# Patient Record
Sex: Female | Born: 1940
Health system: Southern US, Community
[De-identification: ages and names within clinical notes are randomized; demographics above are authoritative.]

## PROBLEM LIST (undated history)

## (undated) ENCOUNTER — Ambulatory Visit: Admission: EM | Payer: Medicare HMO | Source: Home / Self Care

## (undated) DIAGNOSIS — I10 Essential (primary) hypertension: Secondary | ICD-10-CM

## (undated) DIAGNOSIS — E119 Type 2 diabetes mellitus without complications: Secondary | ICD-10-CM

## (undated) HISTORY — PX: CHOLECYSTECTOMY: SHX55

## (undated) HISTORY — PX: ABDOMINAL HYSTERECTOMY: SHX81

## (undated) HISTORY — PX: CARPAL TUNNEL RELEASE: SHX101

## (undated) HISTORY — DX: Essential (primary) hypertension: I10

## (undated) HISTORY — DX: Type 2 diabetes mellitus without complications: E11.9

---
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
## 2013-10-14 ENCOUNTER — Encounter: Payer: Self-pay | Admitting: Internal Medicine

## 2014-10-16 ENCOUNTER — Encounter: Payer: Self-pay | Admitting: Internal Medicine

## 2015-10-30 ENCOUNTER — Telehealth: Payer: Self-pay | Admitting: Internal Medicine

## 2015-10-30 NOTE — Telephone Encounter (Signed)
Rec'd from [facility] forward 38 pages to Dr.Burns

## 2015-12-02 LAB — HM DIABETES EYE EXAM

## 2015-12-29 ENCOUNTER — Other Ambulatory Visit (INDEPENDENT_AMBULATORY_CARE_PROVIDER_SITE_OTHER): Payer: Medicare PPO

## 2015-12-29 ENCOUNTER — Encounter: Payer: Self-pay | Admitting: Internal Medicine

## 2015-12-29 ENCOUNTER — Ambulatory Visit (INDEPENDENT_AMBULATORY_CARE_PROVIDER_SITE_OTHER): Payer: Medicare PPO | Admitting: Internal Medicine

## 2015-12-29 DIAGNOSIS — H04123 Dry eye syndrome of bilateral lacrimal glands: Secondary | ICD-10-CM | POA: Diagnosis not present

## 2015-12-29 DIAGNOSIS — E119 Type 2 diabetes mellitus without complications: Secondary | ICD-10-CM

## 2015-12-29 DIAGNOSIS — I10 Essential (primary) hypertension: Secondary | ICD-10-CM

## 2015-12-29 DIAGNOSIS — I152 Hypertension secondary to endocrine disorders: Secondary | ICD-10-CM | POA: Insufficient documentation

## 2015-12-29 DIAGNOSIS — E1142 Type 2 diabetes mellitus with diabetic polyneuropathy: Secondary | ICD-10-CM | POA: Insufficient documentation

## 2015-12-29 LAB — CBC WITH DIFFERENTIAL/PLATELET
Basophils Absolute: 0 10*3/uL (ref 0.0–0.1)
Basophils Relative: 0.5 % (ref 0.0–3.0)
EOS PCT: 0.9 % (ref 0.0–5.0)
Eosinophils Absolute: 0 10*3/uL (ref 0.0–0.7)
HCT: 35 % — ABNORMAL LOW (ref 36.0–46.0)
Hemoglobin: 11.7 g/dL — ABNORMAL LOW (ref 12.0–15.0)
LYMPHS ABS: 2.1 10*3/uL (ref 0.7–4.0)
Lymphocytes Relative: 42.7 % (ref 12.0–46.0)
MCHC: 33.3 g/dL (ref 30.0–36.0)
MCV: 83.9 fl (ref 78.0–100.0)
MONO ABS: 0.5 10*3/uL (ref 0.1–1.0)
MONOS PCT: 9.7 % (ref 3.0–12.0)
NEUTROS ABS: 2.3 10*3/uL (ref 1.4–7.7)
NEUTROS PCT: 46.2 % (ref 43.0–77.0)
PLATELETS: 247 10*3/uL (ref 150.0–400.0)
RBC: 4.18 Mil/uL (ref 3.87–5.11)
RDW: 15 % (ref 11.5–15.5)
WBC: 4.9 10*3/uL (ref 4.0–10.5)

## 2015-12-29 LAB — HEMOGLOBIN A1C: Hgb A1c MFr Bld: 5.9 % (ref 4.6–6.5)

## 2015-12-29 LAB — COMPREHENSIVE METABOLIC PANEL
ALBUMIN: 4.1 g/dL (ref 3.5–5.2)
ALK PHOS: 94 U/L (ref 39–117)
ALT: 17 U/L (ref 0–35)
AST: 17 U/L (ref 0–37)
BILIRUBIN TOTAL: 0.5 mg/dL (ref 0.2–1.2)
BUN: 17 mg/dL (ref 6–23)
CALCIUM: 9.8 mg/dL (ref 8.4–10.5)
CO2: 27 mEq/L (ref 19–32)
CREATININE: 0.74 mg/dL (ref 0.40–1.20)
Chloride: 107 mEq/L (ref 96–112)
GFR: 98.25 mL/min (ref >60.00)
Glucose, Bld: 110 mg/dL — ABNORMAL HIGH (ref 70–99)
Potassium: 3.7 mEq/L (ref 3.5–5.1)
Sodium: 142 mEq/L (ref 135–145)
TOTAL PROTEIN: 7.5 g/dL (ref 6.0–8.3)

## 2015-12-29 LAB — LIPID PANEL
CHOL/HDL RATIO: 3
Cholesterol: 200 mg/dL (ref 0–200)
HDL: 62 mg/dL (ref >39.00)
LDL Cholesterol: 110 mg/dL — ABNORMAL HIGH (ref 0–99)
NONHDL: 138.07
Triglycerides: 141 mg/dL (ref 0.0–149.0)
VLDL: 28.2 mg/dL (ref 0.0–40.0)

## 2015-12-29 LAB — MICROALBUMIN / CREATININE URINE RATIO
CREATININE, U: 142 mg/dL
Microalb Creat Ratio: 1.4 mg/g (ref 0.0–30.0)
Microalb, Ur: 2 mg/dL — ABNORMAL HIGH (ref 0.0–1.9)

## 2015-12-29 LAB — TSH: TSH: 0.51 u[IU]/mL (ref 0.35–4.50)

## 2015-12-29 NOTE — Patient Instructions (Addendum)
  Test(s) ordered today. Your results will be released to MyChart (or called to you) after review, usually within 72hours after test completion. If any changes need to be made, you will be notified at that same time.  Monitor your blood pressure at home.   No immunizations administered today.   Medications reviewed and updated.  No changes recommended at this time.  Please followup in 6 months

## 2015-12-29 NOTE — Progress Notes (Signed)
Pre visit review using our clinic review tool, if applicable. No additional management support is needed unless otherwise documented below in the visit note. 

## 2015-12-29 NOTE — Assessment & Plan Note (Signed)
Sugars controlled at home Check a1c, urine micro Follow up in 6 months

## 2015-12-29 NOTE — Assessment & Plan Note (Signed)
BP elevated here today Typically well controlled Advised her to start monitoring at home Check cmp Continue current medication F/u in 6 months

## 2015-12-29 NOTE — Progress Notes (Signed)
Subjective:    Patient ID: Wendy Rogers, female    DOB: July 30, 1940, 75 y.o.   MRN: [id number]  HPI She is here to establish with a new pcp.    Hypertension: She is taking her medication daily. She is compliant with a low sodium diet.  She denies chest pain, palpitations, daily edema, shortness of breath and regular headaches. She is exercising regularly.  She does not monitor her blood pressure at home.    Diabetes: She is taking her medication daily as prescribed. She is compliant with a diabetic diet. She is exercising regularly. She checks her feet daily and denies foot lesions. She is up-to-date with an ophthalmology examination.     Medications and allergies reviewed with patient and updated if appropriate.  Patient Active Problem List   Diagnosis Date Noted  . Essential hypertension, benign 12/29/2015  . Diabetes (HCC) 12/29/2015  . Dry eyes 12/29/2015      Medication List    losartan 50 MG tablet Commonly known as:  COZAAR Take 50 mg by mouth daily.   metFORMIN 500 MG tablet Commonly known as:  GLUCOPHAGE Take 500 mg by mouth daily. Take 1 tablet by mouth daily with dinner.        Past Medical History:  Diagnosis Date  . Diabetes mellitus without complication (HCC)   . Hypertension     Past Surgical History:  Procedure Laterality Date  . ABDOMINAL HYSTERECTOMY     fibroids  . CARPAL TUNNEL RELEASE Bilateral   . CHOLECYSTECTOMY      Social History   Social History  . Marital status: Widowed    Spouse name: N/A  . Number of children: N/A  . Years of education: N/A   Social History Main Topics  . Smoking status: Never Smoker  . Smokeless tobacco: Never Used  . Alcohol use Yes     Comment: occasional  . Drug use: No  . Sexual activity: Not Asked   Other Topics Concern  . None   Social History Narrative   Exercise: water aerobics 3 /week    Family History  Problem Relation Age of Onset  . Diabetes Mother   . Pancreatic cancer Sister      Review of Systems  Constitutional: Negative for chills, fatigue, fever and unexpected weight change.  Eyes: Negative for visual disturbance.  Respiratory: Negative for cough, shortness of breath and wheezing.   Cardiovascular: Positive for leg swelling (occasional). Negative for chest pain and palpitations.  Gastrointestinal: Negative for anal bleeding, blood in stool and nausea.       No gerd  Endocrine: Positive for cold intolerance.  Genitourinary: Negative for dysuria and hematuria.  Musculoskeletal: Positive for back pain (occasional).  Neurological: Positive for light-headedness (occasional). Negative for headaches.  Psychiatric/Behavioral: Negative for dysphoric mood. The patient is not nervous/anxious.        Objective:   Vitals:   12/29/15 0859  BP: (!) 184/82  Pulse: 78  Resp: 16  Temp: 97.8 F (36.6 C)   Filed Weights   12/29/15 0859  Weight: 168 lb (76.2 kg)   Body mass index is 33.93 kg/m.   Physical Exam Constitutional: She appears well-developed and well-nourished. No distress.  HENT:  Head: Normocephalic and atraumatic.  Right Ear: External ear normal. Normal ear canal and TM Left Ear: External ear normal.  Normal ear canal and TM Mouth/Throat: Oropharynx is clear and moist.  Eyes: Conjunctivae are normal.  Neck: Neck supple. No tracheal deviation present. No thyromegaly  present.  No carotid bruit  Cardiovascular: Normal rate, regular rhythm and normal heart sounds.   No murmur heard.  No edema. Pulmonary/Chest: Effort normal and breath sounds normal. No respiratory distress. She has no wheezes. She has no rales.  Abdominal: Soft. She exhibits no distension. There is no tenderness.  Lymphadenopathy: She has no cervical adenopathy.  Skin: Skin is warm and dry. She is not diaphoretic.  Psychiatric: She has a normal mood and affect. Her behavior is normal.         Assessment & Plan:   See Problem List for Assessment and Plan of chronic  medical problems.  F/u in 6 months

## 2016-02-23 ENCOUNTER — Other Ambulatory Visit: Payer: Self-pay | Admitting: *Deleted

## 2016-02-23 MED ORDER — METFORMIN HCL 500 MG PO TABS: 500.0000 mg | ORAL_TABLET | Freq: Every day | ORAL | 1 refills | Status: DC

## 2016-02-23 NOTE — Telephone Encounter (Signed)
Rec'd call pt is requesting refill on her metformin. Verified pharmacy inform will send to [facility] electronically...Raechel Chute/lmb

## 2016-07-03 NOTE — Progress Notes (Signed)
Subjective:    Patient ID: Wendy Rogers, female    DOB: 10-05-1940, 76 y.o.   MRN: [id number]  HPI The patient is here for follow up.  Hypertension: She is taking her medication daily. She is compliant with a low sodium diet.  She denies chest pain, palpitations, edema, shortness of breath and regular headaches. She is not exercising regularly.  She does monitor her blood pressure at home, But not regularly. The last time she took it a few days ago it was slightly elevated-she does not recall the exact number..    Diabetes: She is taking her medication daily as prescribed. She is compliant with a diabetic diet. She is not exercising regularly.  She checks her feet daily and denies foot lesions. She has some tingling and burning sensation in her feet.  She is up-to-date with an ophthalmology examination.   Feet pain, tingling: The other week her right first toe swelled and hurt.  Sometimes the balls of her feet swell.  Sometimes she has burning or needle sensation in her feet and lower legs. She sometimes has tingling in her hands.    Medications and allergies reviewed with patient and updated if appropriate.  Patient Active Problem List   Diagnosis Date Noted  . Tingling sensation 07/04/2016  . Essential hypertension, benign 12/29/2015  . Diabetes (HCC) 12/29/2015  . Dry eyes 12/29/2015    No current outpatient prescriptions on file prior to visit.   No current facility-administered medications on file prior to visit.     Past Medical History:  Diagnosis Date  . Diabetes mellitus without complication (HCC)   . Hypertension     Past Surgical History:  Procedure Laterality Date  . ABDOMINAL HYSTERECTOMY     fibroids  . CARPAL TUNNEL RELEASE Bilateral   . CHOLECYSTECTOMY      Social History   Social History  . Marital status: Widowed    Spouse name: N/A  . Number of children: N/A  . Years of education: N/A   Social History Main Topics  . Smoking status: Never  Smoker  . Smokeless tobacco: Never Used  . Alcohol use Yes     Comment: occasional  . Drug use: No  . Sexual activity: Not Asked   Other Topics Concern  . None   Social History Narrative   Exercise: water aerobics 3 /week    Family History  Problem Relation Age of Onset  . Diabetes Mother   . Pancreatic cancer Sister     Review of Systems  Constitutional: Negative for chills and fever.  Respiratory: Negative for cough, shortness of breath and wheezing.   Cardiovascular: Negative for chest pain, palpitations and leg swelling.  Neurological: Negative for light-headedness and headaches.       Objective:   Vitals:   07/04/16 0936  BP: (!) 172/92  Pulse: 94  Resp: 16  Temp: 98.3 F (36.8 C)   Wt Readings from Last 3 Encounters:  07/04/16 166 lb (75.3 kg)  12/29/15 168 lb (76.2 kg)   Body mass index is 33.53 kg/m.   Physical Exam    Constitutional: Appears well-developed and well-nourished. No distress.  HENT:  Head: Normocephalic and atraumatic.  Neck: Neck supple. No tracheal deviation present. No thyromegaly present.  No cervical lymphadenopathy Cardiovascular: Normal rate, regular rhythm and normal heart sounds.   No murmur heard. No carotid bruit .  No edema Pulmonary/Chest: Effort normal and breath sounds normal. No respiratory distress. No has no wheezes. No rales.  Skin: Skin is warm and dry. Not diaphoretic.  Psychiatric: Normal mood and affect. Behavior is normal.      Assessment & Plan:   Mammogram due-she requested a referral  See Problem List for Assessment and Plan of chronic medical problems.   Follow-up in 4 weeks to recheck blood pressure-losartan increase to 100 mg

## 2016-07-03 NOTE — Patient Instructions (Addendum)
  Test(s) ordered today. Your results will be released to MyChart (or called to you) after review, usually within 72hours after test completion. If any changes need to be made, you will be notified at that same time.  All other Health Maintenance issues reviewed.   All recommended immunizations and age-appropriate screenings are up-to-date or discussed.  No immunizations administered today.   Medications reviewed and updated.  Changes include increasing losartan 100 mg daily.   Your prescription(s) have been submitted to your pharmacy. Please take as directed and contact our office if you believe you are having problem(s) with the medication(s).   Please followup in 1 month

## 2016-07-04 ENCOUNTER — Other Ambulatory Visit: Payer: Self-pay | Admitting: Emergency Medicine

## 2016-07-04 ENCOUNTER — Telehealth: Payer: Self-pay | Admitting: *Deleted

## 2016-07-04 ENCOUNTER — Ambulatory Visit (INDEPENDENT_AMBULATORY_CARE_PROVIDER_SITE_OTHER): Payer: Medicare PPO | Admitting: Internal Medicine

## 2016-07-04 ENCOUNTER — Encounter: Payer: Self-pay | Admitting: Internal Medicine

## 2016-07-04 ENCOUNTER — Other Ambulatory Visit (INDEPENDENT_AMBULATORY_CARE_PROVIDER_SITE_OTHER): Payer: Medicare PPO

## 2016-07-04 VITALS — BP 172/92 | HR 94 | Temp 98.3°F | Resp 16 | Wt 166.0 lb

## 2016-07-04 DIAGNOSIS — I10 Essential (primary) hypertension: Secondary | ICD-10-CM | POA: Diagnosis not present

## 2016-07-04 DIAGNOSIS — E119 Type 2 diabetes mellitus without complications: Secondary | ICD-10-CM | POA: Diagnosis not present

## 2016-07-04 DIAGNOSIS — E538 Deficiency of other specified B group vitamins: Secondary | ICD-10-CM | POA: Insufficient documentation

## 2016-07-04 DIAGNOSIS — G629 Polyneuropathy, unspecified: Secondary | ICD-10-CM | POA: Insufficient documentation

## 2016-07-04 DIAGNOSIS — M79671 Pain in right foot: Secondary | ICD-10-CM

## 2016-07-04 DIAGNOSIS — Z1231 Encounter for screening mammogram for malignant neoplasm of breast: Secondary | ICD-10-CM | POA: Diagnosis not present

## 2016-07-04 DIAGNOSIS — R202 Paresthesia of skin: Secondary | ICD-10-CM | POA: Diagnosis not present

## 2016-07-04 DIAGNOSIS — M79672 Pain in left foot: Secondary | ICD-10-CM

## 2016-07-04 DIAGNOSIS — E1142 Type 2 diabetes mellitus with diabetic polyneuropathy: Secondary | ICD-10-CM | POA: Insufficient documentation

## 2016-07-04 LAB — COMPREHENSIVE METABOLIC PANEL
ALT: 21 U/L (ref 0–35)
AST: 19 U/L (ref 0–37)
Albumin: 4.3 g/dL (ref 3.5–5.2)
Alkaline Phosphatase: 86 U/L (ref 39–117)
BUN: 23 mg/dL (ref 6–23)
CHLORIDE: 108 meq/L (ref 96–112)
CO2: 28 meq/L (ref 19–32)
Calcium: 10 mg/dL (ref 8.4–10.5)
Creatinine, Ser: 0.82 mg/dL (ref 0.40–1.20)
GFR: 87.15 mL/min (ref >60.00)
GLUCOSE: 97 mg/dL (ref 70–99)
POTASSIUM: 3.7 meq/L (ref 3.5–5.1)
SODIUM: 143 meq/L (ref 135–145)
Total Bilirubin: 0.5 mg/dL (ref 0.2–1.2)
Total Protein: 7.6 g/dL (ref 6.0–8.3)

## 2016-07-04 LAB — CBC WITH DIFFERENTIAL/PLATELET
BASOS PCT: 0.7 % (ref 0.0–3.0)
Basophils Absolute: 0 10*3/uL (ref 0.0–0.1)
Eosinophils Absolute: 0 10*3/uL (ref 0.0–0.7)
Eosinophils Relative: 0.8 % (ref 0.0–5.0)
HEMATOCRIT: 37.8 % (ref 36.0–46.0)
HEMOGLOBIN: 12.2 g/dL (ref 12.0–15.0)
LYMPHS PCT: 48.4 % — AB (ref 12.0–46.0)
Lymphs Abs: 2.4 10*3/uL (ref 0.7–4.0)
MCHC: 32.3 g/dL (ref 30.0–36.0)
MCV: 87.7 fl (ref 78.0–100.0)
MONOS PCT: 8.1 % (ref 3.0–12.0)
Monocytes Absolute: 0.4 10*3/uL (ref 0.1–1.0)
NEUTROS ABS: 2.1 10*3/uL (ref 1.4–7.7)
Neutrophils Relative %: 42 % — ABNORMAL LOW (ref 43.0–77.0)
PLATELETS: 244 10*3/uL (ref 150.0–400.0)
RBC: 4.31 Mil/uL (ref 3.87–5.11)
RDW: 14.4 % (ref 11.5–15.5)
WBC: 5 10*3/uL (ref 4.0–10.5)

## 2016-07-04 LAB — HEMOGLOBIN A1C: Hgb A1c MFr Bld: 5.9 % (ref 4.6–6.5)

## 2016-07-04 LAB — VITAMIN B12: Vitamin B-12: 280 pg/mL (ref 211–911)

## 2016-07-04 MED ORDER — LOSARTAN POTASSIUM 100 MG PO TABS: 100.0000 mg | ORAL_TABLET | Freq: Every day | ORAL | 3 refills | Status: DC

## 2016-07-04 MED ORDER — METFORMIN HCL 500 MG PO TABS
500.0000 mg | ORAL_TABLET | Freq: Every day | ORAL | 3 refills | Status: DC
Start: 2016-07-04 — End: 2017-06-22

## 2016-07-04 MED ORDER — METFORMIN HCL 500 MG PO TABS: 500.0000 mg | ORAL_TABLET | Freq: Every day | ORAL | 1 refills | Status: DC

## 2016-07-04 MED ORDER — METFORMIN HCL 500 MG PO TABS
500.0000 mg | ORAL_TABLET | Freq: Every day | ORAL | 3 refills | Status: DC
Start: 2016-07-04 — End: 2016-07-04

## 2016-07-04 NOTE — Assessment & Plan Note (Signed)
Blood pressure not likely controlled Increase losartan 100 mg daily Continue to monitor blood pressure at home-monitor daily Check blood work Stressed low-sodium diet, regular exercise and advised weight loss Follow-up in 4 weeks

## 2016-07-04 NOTE — Telephone Encounter (Signed)
Rec'd call from pharmacy she states 2 rx was sent on pt losartan & metformin, but the date of birth is incorrect. Pt DOB September 20, 2040. Needing rx change w/correct DOB. Updated DOB re-sent rx...Raechel Chute/lmb

## 2016-07-04 NOTE — Progress Notes (Signed)
Pre visit review using our clinic review tool, if applicable. No additional management support is needed unless otherwise documented below in the visit note. 

## 2016-07-04 NOTE — Assessment & Plan Note (Signed)
In feet and hands Check B12 to rule out B12 deficiency Possible diabetic neuropathy, although sugars have been well controlled ? Other neuropathy

## 2016-07-04 NOTE — Assessment & Plan Note (Signed)
Bilateral balls of feet-associated with some swelling Likely related to osteoarthritis Discussed footwear Discussed possible referral to podiatry-deferred today

## 2016-07-04 NOTE — Assessment & Plan Note (Signed)
Sugars have been well controlled Check a1c Continue metformin 500 mg daily

## 2016-07-05 ENCOUNTER — Encounter: Payer: Self-pay | Admitting: Emergency Medicine

## 2016-08-03 NOTE — Progress Notes (Signed)
Subjective:    Patient ID: Wendy Rogers, female    DOB: 08-Jun-1940, 76 y.o.   MRN: [id number]  HPI The patient is here for follow up.  Hypertension: We increased her losartan to 100 mg one month ago.  She is taking her medication daily. She is compliant with a low sodium diet.  She denies chest pain, palpitations, edema, shortness of breath and regular headaches. She is not exercising regularly.  She does monitor her blood pressure at home, but the cuff appears to be too tight and may not be accurate.    Tingling in feet and hands: B12 level with recent blood work on on low-normal side.  She is taking B12 daily.    Medications and allergies reviewed with patient and updated if appropriate.  Patient Active Problem List   Diagnosis Date Noted  . Tingling sensation 07/04/2016  . Foot pain, bilateral 07/04/2016  . Low vitamin B12 level 07/04/2016  . Essential hypertension, benign 12/29/2015  . Diabetes (HCC) 12/29/2015  . Dry eyes 12/29/2015    Current Outpatient Prescriptions on File Prior to Visit  Medication Sig Dispense Refill  . losartan (COZAAR) 100 MG tablet Take 1 tablet (100 mg total) by mouth daily. 90 tablet 3  . metFORMIN (GLUCOPHAGE) 500 MG tablet Take 1 tablet (500 mg total) by mouth daily. With dinner 90 tablet 3   No current facility-administered medications on file prior to visit.     Past Medical History:  Diagnosis Date  . Diabetes mellitus without complication (HCC)   . Hypertension     Past Surgical History:  Procedure Laterality Date  . ABDOMINAL HYSTERECTOMY     fibroids  . CARPAL TUNNEL RELEASE Bilateral   . CHOLECYSTECTOMY      Social History   Social History  . Marital status: Widowed    Spouse name: N/A  . Number of children: N/A  . Years of education: N/A   Social History Main Topics  . Smoking status: Never Smoker  . Smokeless tobacco: Never Used  . Alcohol use Yes     Comment: occasional  . Drug use: No  . Sexual activity: Not  on file   Other Topics Concern  . Not on file   Social History Narrative   Exercise: water aerobics 3 /week    Family History  Problem Relation Age of Onset  . Diabetes Mother   . Pancreatic cancer Sister     Review of Systems  Eyes:       Dry eyes  Respiratory: Negative for shortness of breath.   Cardiovascular: Negative for chest pain, palpitations and leg swelling.  Neurological: Positive for headaches (occasional, no regular headaches). Negative for dizziness and light-headedness.       Objective:   Vitals:   08/04/16 0942  BP: (!) 172/88  Pulse: 99  Resp: 16  Temp: 97.9 F (36.6 C)   Wt Readings from Last 3 Encounters:  08/04/16 168 lb (76.2 kg)  07/04/16 166 lb (75.3 kg)  12/29/15 168 lb (76.2 kg)   Body mass index is 33.93 kg/m.   Physical Exam    Constitutional: Appears well-developed and well-nourished. No distress.  HENT:  Head: Normocephalic and atraumatic.  Neck: Neck supple. No tracheal deviation present. No thyromegaly present.  No cervical lymphadenopathy Cardiovascular: Normal rate, regular rhythm and normal heart sounds.   No murmur heard. No carotid bruit .  No edema Pulmonary/Chest: Effort normal and breath sounds normal. No respiratory distress. No has no wheezes.  No rales.  Skin: Skin is warm and dry. Not diaphoretic.  Psychiatric: Normal mood and affect. Behavior is normal.      Assessment & Plan:    See Problem List for Assessment and Plan of chronic medical problems.

## 2016-08-04 ENCOUNTER — Encounter: Payer: Self-pay | Admitting: Internal Medicine

## 2016-08-04 ENCOUNTER — Ambulatory Visit (INDEPENDENT_AMBULATORY_CARE_PROVIDER_SITE_OTHER): Payer: Medicare PPO | Admitting: Internal Medicine

## 2016-08-04 VITALS — BP 172/88 | HR 99 | Temp 97.9°F | Resp 16 | Wt 168.0 lb

## 2016-08-04 DIAGNOSIS — I10 Essential (primary) hypertension: Secondary | ICD-10-CM | POA: Diagnosis not present

## 2016-08-04 MED ORDER — AMLODIPINE BESYLATE 5 MG PO TABS: 5.0000 mg | ORAL_TABLET | Freq: Every day | ORAL | 3 refills | Status: DC

## 2016-08-04 MED ORDER — VITAMIN B-12 1000 MCG PO TABS: 1000.0000 ug | ORAL_TABLET | Freq: Every day | ORAL | Status: AC

## 2016-08-04 NOTE — Progress Notes (Signed)
Pre visit review using our clinic review tool, if applicable. No additional management support is needed unless otherwise documented below in the visit note. 

## 2016-08-04 NOTE — Assessment & Plan Note (Signed)
Not controlled discussed consequences of uncontrolled htn Start amlodipine 5 mg daily Continue losartan 100 mg daily Monitor at home - new cuff

## 2016-08-04 NOTE — Patient Instructions (Addendum)
Medications reviewed and updated.  Changes include  Adding amlodipine 5 mg daily.   Your prescription(s) have been submitted to your pharmacy. Please take as directed and contact our office if you believe you are having problem(s) with the medication(s).    Please followup in 3 weeks     Hypertension Hypertension, commonly called high blood pressure, is when the force of blood pumping through the arteries is too strong. The arteries are the blood vessels that carry blood from the heart throughout the body. Hypertension forces the heart to work harder to pump blood and may cause arteries to become narrow or stiff. Having untreated or uncontrolled hypertension can cause heart attacks, strokes, kidney disease, and other problems. A blood pressure reading consists of a higher number over a lower number. Ideally, your blood pressure should be below 120/80. The first ("top") number is called the systolic pressure. It is a measure of the pressure in your arteries as your heart beats. The second ("bottom") number is called the diastolic pressure. It is a measure of the pressure in your arteries as the heart relaxes. What are the causes? The cause of this condition is not known. What increases the risk? Some risk factors for high blood pressure are under your control. Others are not. Factors you can change   Smoking.  Having type 2 diabetes mellitus, high cholesterol, or both.  Not getting enough exercise or physical activity.  Being overweight.  Having too much fat, sugar, calories, or salt (sodium) in your diet.  Drinking too much alcohol. Factors that are difficult or impossible to change   Having chronic kidney disease.  Having a family history of high blood pressure.  Age. Risk increases with age.  Race. You may be at higher risk if you are African-American.  Gender. Men are at higher risk than women before age 72. After age 2, women are at higher risk than men.  Having  obstructive sleep apnea.  Stress. What are the signs or symptoms? Extremely high blood pressure (hypertensive crisis) may cause:  Headache.  Anxiety.  Shortness of breath.  Nosebleed.  Nausea and vomiting.  Severe chest pain.  Jerky movements you cannot control (seizures). How is this diagnosed? This condition is diagnosed by measuring your blood pressure while you are seated, with your arm resting on a surface. The cuff of the blood pressure monitor will be placed directly against the skin of your upper arm at the level of your heart. It should be measured at least twice using the same arm. Certain conditions can cause a difference in blood pressure between your right and left arms. Certain factors can cause blood pressure readings to be lower or higher than normal (elevated) for a short period of time:  When your blood pressure is higher when you are in a health care provider's office than when you are at home, this is called white coat hypertension. Most people with this condition do not need medicines.  When your blood pressure is higher at home than when you are in a health care provider's office, this is called masked hypertension. Most people with this condition may need medicines to control blood pressure. If you have a high blood pressure reading during one visit or you have normal blood pressure with other risk factors:  You may be asked to return on a different day to have your blood pressure checked again.  You may be asked to monitor your blood pressure at home for 1 week or longer. If  you are diagnosed with hypertension, you may have other blood or imaging tests to help your health care provider understand your overall risk for other conditions. How is this treated? This condition is treated by making healthy lifestyle changes, such as eating healthy foods, exercising more, and reducing your alcohol intake. Your health care provider may prescribe medicine if lifestyle  changes are not enough to get your blood pressure under control, and if:  Your systolic blood pressure is above 130.  Your diastolic blood pressure is above 80. Your personal target blood pressure may vary depending on your medical conditions, your age, and other factors. Follow these instructions at home: Eating and drinking   Eat a diet that is high in fiber and potassium, and low in sodium, added sugar, and fat. An example eating plan is called the DASH (Dietary Approaches to Stop Hypertension) diet. To eat this way:  Eat plenty of fresh fruits and vegetables. Try to fill half of your plate at each meal with fruits and vegetables.  Eat whole grains, such as whole wheat pasta, brown rice, or whole grain bread. Fill about one quarter of your plate with whole grains.  Eat or drink low-fat dairy products, such as skim milk or low-fat yogurt.  Avoid fatty cuts of meat, processed or cured meats, and poultry with skin. Fill about one quarter of your plate with lean proteins, such as fish, chicken without skin, beans, eggs, and tofu.  Avoid premade and processed foods. These tend to be higher in sodium, added sugar, and fat.  Reduce your daily sodium intake. Most people with hypertension should eat less than 1,500 mg of sodium a day.  Limit alcohol intake to no more than 1 drink a day for nonpregnant women and 2 drinks a day for men. One drink equals 12 oz of beer, 5 oz of wine, or 1 oz of hard liquor. Lifestyle   Work with your health care provider to maintain a healthy body weight or to lose weight. Ask what an ideal weight is for you.  Get at least 30 minutes of exercise that causes your heart to beat faster (aerobic exercise) most days of the week. Activities may include walking, swimming, or biking.  Include exercise to strengthen your muscles (resistance exercise), such as pilates or lifting weights, as part of your weekly exercise routine. Try to do these types of exercises for 30  minutes at least 3 days a week.  Do not use any products that contain nicotine or tobacco, such as cigarettes and e-cigarettes. If you need help quitting, ask your health care provider.  Monitor your blood pressure at home as told by your health care provider.  Keep all follow-up visits as told by your health care provider. This is important. Medicines   Take over-the-counter and prescription medicines only as told by your health care provider. Follow directions carefully. Blood pressure medicines must be taken as prescribed.  Do not skip doses of blood pressure medicine. Doing this puts you at risk for problems and can make the medicine less effective.  Ask your health care provider about side effects or reactions to medicines that you should watch for. Contact a health care provider if:  You think you are having a reaction to a medicine you are taking.  You have headaches that keep coming back (recurring).  You feel dizzy.  You have swelling in your ankles.  You have trouble with your vision. Get help right away if:  You develop a severe  headache or confusion.  You have unusual weakness or numbness.  You feel faint.  You have severe pain in your chest or abdomen.  You vomit repeatedly.  You have trouble breathing. Summary  Hypertension is when the force of blood pumping through your arteries is too strong. If this condition is not controlled, it may put you at risk for serious complications.  Your personal target blood pressure may vary depending on your medical conditions, your age, and other factors. For most people, a normal blood pressure is less than 120/80.  Hypertension is treated with lifestyle changes, medicines, or a combination of both. Lifestyle changes include weight loss, eating a healthy, low-sodium diet, exercising more, and limiting alcohol. This information is not intended to replace advice given to you by your health care provider. Make sure you  discuss any questions you have with your health care provider. Document Released: 05/16/2005 Document Revised: 04/13/2016 Document Reviewed: 04/13/2016 Elsevier Interactive Patient Education  2017 [employer].

## 2016-08-09 ENCOUNTER — Ambulatory Visit
Admission: RE | Admit: 2016-08-09 | Discharge: 2016-08-09 | Disposition: A | Discharge status: Home / Self Care | Payer: Medicare PPO | Source: Ambulatory Visit | Attending: Internal Medicine | Admitting: Internal Medicine

## 2016-08-09 DIAGNOSIS — Z1231 Encounter for screening mammogram for malignant neoplasm of breast: Secondary | ICD-10-CM

## 2016-10-13 ENCOUNTER — Encounter: Payer: Self-pay | Admitting: Internal Medicine

## 2016-12-11 NOTE — Patient Instructions (Addendum)
Your goal BP is less than 140/90.   Test(s) ordered today. Your results will be released to MyChart (or called to you) after review, usually within 72hours after test completion. If any changes need to be made, you will be notified at that same time.  All other Health Maintenance issues reviewed.   All recommended immunizations and age-appropriate screenings are up-to-date or discussed.  No immunizations administered today.   Medications reviewed and updated.   No changes recommended at this time.  Your prescription(s) have been submitted to your pharmacy. Please take as directed and contact our office if you believe you are having problem(s) with the medication(s).   Please followup in 5-6 months

## 2016-12-11 NOTE — Progress Notes (Signed)
Subjective:    Patient ID: Wendy Rogers, female    DOB: August 16, 1940, 76 y.o.   MRN: [id number]  HPI The patient is here for follow up.  Hypertension: She is taking her medication daily. She is not always compliant with a low sodium diet.  She denies chest pain, palpitations, edema, shortness of breath and regular headaches. She is exercising regularly.  She does monitor her blood pressure at home 153/80.    Diabetes: She is taking her medication daily as prescribed. She is compliant with a diabetic diet. She is exercising regularly. She checks her feet daily and denies foot lesions. She is up-to-date with an ophthalmology examination.   B12 def:  She is taking B12 daily.  She denies fatigue.  She does have intermittent prickly sensation in her feet.    Medications and allergies reviewed with patient and updated if appropriate.  Patient Active Problem List   Diagnosis Date Noted  . Tingling sensation 07/04/2016  . Foot pain, bilateral 07/04/2016  . Low vitamin B12 level 07/04/2016  . Essential hypertension, benign 12/29/2015  . Diabetes (HCC) 12/29/2015  . Dry eyes 12/29/2015    Current Outpatient Prescriptions on File Prior to Visit  Medication Sig Dispense Refill  . amLODipine (NORVASC) 5 MG tablet Take 1 tablet (5 mg total) by mouth daily. 30 tablet 3  . losartan (COZAAR) 100 MG tablet Take 1 tablet (100 mg total) by mouth daily. 90 tablet 3  . vitamin B-12 (CYANOCOBALAMIN) 1000 MCG tablet Take 1 tablet (1,000 mcg total) by mouth daily.    . metFORMIN (GLUCOPHAGE) 500 MG tablet Take 1 tablet (500 mg total) by mouth daily. With dinner 90 tablet 3   No current facility-administered medications on file prior to visit.     Past Medical History:  Diagnosis Date  . Diabetes mellitus without complication (HCC)   . Hypertension     Past Surgical History:  Procedure Laterality Date  . ABDOMINAL HYSTERECTOMY     fibroids  . CARPAL TUNNEL RELEASE Bilateral   .  CHOLECYSTECTOMY      Social History   Social History  . Marital status: Widowed    Spouse name: N/A  . Number of children: N/A  . Years of education: N/A   Social History Main Topics  . Smoking status: Never Smoker  . Smokeless tobacco: Never Used  . Alcohol use Yes     Comment: occasional  . Drug use: No  . Sexual activity: Not Asked   Other Topics Concern  . None   Social History Narrative   Exercise: water aerobics 3 /week    Family History  Problem Relation Age of Onset  . Diabetes Mother   . Pancreatic cancer Sister     Review of Systems  Constitutional: Negative for chills and fever.  Respiratory: Negative for cough, shortness of breath and wheezing.   Cardiovascular: Negative for chest pain, palpitations and leg swelling.  Endocrine: Positive for cold intolerance.  Musculoskeletal: Positive for arthralgias.  Neurological: Negative for light-headedness, numbness (intermittent prickling feeling feet) and headaches.       Objective:   Vitals:   12/12/16 0938  BP: (!) 172/84  Pulse: 96  Resp: 16  Temp: 98.8 F (37.1 C)   Wt Readings from Last 3 Encounters:  12/12/16 166 lb (75.3 kg)  08/04/16 168 lb (76.2 kg)  07/04/16 166 lb (75.3 kg)   Body mass index is 33.53 kg/m.   Physical Exam    Constitutional: Appears well-developed  and well-nourished. No distress.  HENT:  Head: Normocephalic and atraumatic.  Neck: Neck supple. No tracheal deviation present. No thyromegaly present.  No cervical lymphadenopathy Cardiovascular: Normal rate, regular rhythm and normal heart sounds.   No murmur heard. No carotid bruit .  No edema Pulmonary/Chest: Effort normal and breath sounds normal. No respiratory distress. No has no wheezes. No rales.  Skin: Skin is warm and dry. Not diaphoretic.  Psychiatric: Normal mood and affect. Behavior is normal.      Assessment & Plan:   Deferred all vaccines. Will think about dexa  See Problem List for Assessment and  Plan of chronic medical problems.

## 2016-12-12 ENCOUNTER — Encounter: Payer: Self-pay | Admitting: Internal Medicine

## 2016-12-12 ENCOUNTER — Ambulatory Visit (INDEPENDENT_AMBULATORY_CARE_PROVIDER_SITE_OTHER): Payer: Medicare PPO | Admitting: Internal Medicine

## 2016-12-12 ENCOUNTER — Other Ambulatory Visit (INDEPENDENT_AMBULATORY_CARE_PROVIDER_SITE_OTHER): Payer: Medicare PPO

## 2016-12-12 VITALS — BP 172/84 | HR 96 | Temp 98.8°F | Resp 16 | Wt 166.0 lb

## 2016-12-12 DIAGNOSIS — E538 Deficiency of other specified B group vitamins: Secondary | ICD-10-CM | POA: Diagnosis not present

## 2016-12-12 DIAGNOSIS — E119 Type 2 diabetes mellitus without complications: Secondary | ICD-10-CM | POA: Diagnosis not present

## 2016-12-12 DIAGNOSIS — I10 Essential (primary) hypertension: Secondary | ICD-10-CM

## 2016-12-12 LAB — COMPREHENSIVE METABOLIC PANEL
ALBUMIN: 4 g/dL (ref 3.5–5.2)
ALK PHOS: 72 U/L (ref 39–117)
ALT: 22 U/L (ref 0–35)
AST: 21 U/L (ref 0–37)
BUN: 21 mg/dL (ref 6–23)
CALCIUM: 9.9 mg/dL (ref 8.4–10.5)
CO2: 27 mEq/L (ref 19–32)
CREATININE: 0.78 mg/dL (ref 0.40–1.20)
Chloride: 110 mEq/L (ref 96–112)
GFR: 92.22 mL/min (ref >60.00)
Glucose, Bld: 125 mg/dL — ABNORMAL HIGH (ref 70–99)
POTASSIUM: 4 meq/L (ref 3.5–5.1)
SODIUM: 143 meq/L (ref 135–145)
TOTAL PROTEIN: 7 g/dL (ref 6.0–8.3)
Total Bilirubin: 0.3 mg/dL (ref 0.2–1.2)

## 2016-12-12 LAB — VITAMIN B12: Vitamin B-12: 391 pg/mL (ref 211–911)

## 2016-12-12 LAB — LIPID PANEL
CHOLESTEROL: 202 mg/dL — AB (ref 0–200)
HDL: 56.5 mg/dL (ref >39.00)
LDL Cholesterol: 122 mg/dL — ABNORMAL HIGH (ref 0–99)
NonHDL: 145.58
TRIGLYCERIDES: 119 mg/dL (ref 0.0–149.0)
Total CHOL/HDL Ratio: 4
VLDL: 23.8 mg/dL (ref 0.0–40.0)

## 2016-12-12 LAB — HEMOGLOBIN A1C: Hgb A1c MFr Bld: 5.8 % (ref 4.6–6.5)

## 2016-12-12 NOTE — Assessment & Plan Note (Signed)
Check a1c Continue low sugar / carb diet Continue regular exercise Will get eye report

## 2016-12-12 NOTE — Assessment & Plan Note (Signed)
Has intermittent prickling in feet Taking b12 daily Check level

## 2016-12-12 NOTE — Assessment & Plan Note (Signed)
Elevated here today, slightly elevated at home She is not compliant with a low sodium diet - discussed potential effects on organs of uncontrolled htn Stressed low sodium diet Continue regular exercise Monitor BP - goal is less than 140/90 - discussed if persistently elevated we should increase medication cmp today

## 2017-03-27 DIAGNOSIS — H903 Sensorineural hearing loss, bilateral: Secondary | ICD-10-CM | POA: Diagnosis not present

## 2017-05-12 NOTE — Progress Notes (Signed)
Subjective:   Wendy Rogers is a 76 y.o. female who presents for an Initial Medicare Annual Wellness Visit.  Review of Systems    No ROS.  Medicare Wellness Visit. Additional risk factors are reflected in the social history.  Cardiac Risk Factors include: advanced age (>32men, >74 women);diabetes mellitus;hypertension Sleep patterns: feels rested on waking, gets up 3-4 times nightly to void and sleeps 6-7 hours nightly.    Home Safety/Smoke Alarms: Feels safe in home. Smoke alarms in place.  Living environment; residence and Firearm Safety: 2-story house, no firearms.Lives with son, no needs for DME, good support system Seat Belt Safety/Bike Helmet: Wears seat belt.   Objective:    Today's Vitals   05/15/17 0909  BP: (!) 142/84  Pulse: 85  Resp: 16  Temp: 97.9 F (36.6 C)  TempSrc: Oral  SpO2: 96%  Weight: 165 lb (74.8 kg)  Height: 4\' 11"  (1.499 m)   Body mass index is 33.33 kg/m.  Advanced Directives 05/15/2017  Does Patient Have a Medical Advance Directive? No  Would patient like information on creating a medical advance directive? Yes (ED - Information included in AVS)    Current Medications (verified) Outpatient Encounter Medications as of 05/15/2017  Medication Sig  . amLODipine (NORVASC) 5 MG tablet Take 1 tablet (5 mg total) by mouth daily.  Marland Kitchen losartan (COZAAR) 100 MG tablet Take 1 tablet (100 mg total) by mouth daily.  . vitamin B-12 (CYANOCOBALAMIN) 1000 MCG tablet Take 1 tablet (1,000 mcg total) by mouth daily.  . metFORMIN (GLUCOPHAGE) 500 MG tablet Take 1 tablet (500 mg total) by mouth daily. With dinner   No facility-administered encounter medications on file as of 05/15/2017.     Allergies (verified) Patient has no known allergies.   History: Past Medical History:  Diagnosis Date  . Diabetes mellitus without complication (HCC)   . Hypertension    Past Surgical History:  Procedure Laterality Date  . ABDOMINAL HYSTERECTOMY     fibroids  .  CARPAL TUNNEL RELEASE Bilateral   . CHOLECYSTECTOMY     Family History  Problem Relation Age of Onset  . Diabetes Mother   . Pancreatic cancer Sister    Social History   Socioeconomic History  . Marital status: Widowed    Spouse name: None  . Number of children: None  . Years of education: None  . Highest education level: None  Social Needs  . Financial resource strain: None  . Food insecurity - worry: None  . Food insecurity - inability: None  . Transportation needs - medical: None  . Transportation needs - non-medical: None  Occupational History  . None  Tobacco Use  . Smoking status: Never Smoker  . Smokeless tobacco: Never Used  Substance and Sexual Activity  . Alcohol use: Yes    Comment: occasional  . Drug use: No  . Sexual activity: None  Other Topics Concern  . None  Social History Narrative   Exercise: water aerobics 3 /week    Tobacco Counseling Counseling given: Not Answered  Activities of Daily Living In your present state of health, do you have any difficulty performing the following activities: 05/15/2017  Hearing? N  Vision? N  Difficulty concentrating or making decisions? N  Walking or climbing stairs? N  Dressing or bathing? N  Doing errands, shopping? N  Preparing Food and eating ? N  Using the Toilet? N  In the past six months, have you accidently leaked urine? N  Do you have problems  with loss of bowel control? N  Managing your Medications? N  Managing your Finances? N  Housekeeping or managing your Housekeeping? N  Some recent data might be hidden     Immunizations and Health Maintenance  There is no immunization history on file for this patient. Health Maintenance Due  Topic Date Due  . OPHTHALMOLOGY EXAM  12/01/2016  . FOOT EXAM  12/28/2016    Patient Care Team: Pincus SanesBurns, Stacy J, MD as PCP - General (Internal Medicine)  Indicate any recent Medical Services you may have received from other than [facility] providers in the past year  (date may be approximate).     Assessment:   This is a routine wellness examination for Wendy Rogers. Physical assessment deferred to PCP.   Hearing/Vision screen Hearing Screening Comments: HOH, recently saw audiologist and is investigating options for hearing aids.   Vision Screening Comments: appointment yearly Dr. Jettie BoozeBernstoff  Dietary issues and exercise activities discussed: Current Exercise Habits: Home exercise routine, Type of exercise: walking, Time (Minutes): 55, Frequency (Times/Week): 6, Weekly Exercise (Minutes/Week): 330  Diet (meal preparation, eat out, water intake, caffeinated beverages, dairy products, fruits and vegetables): in general, a "healthy" diet  , well balanced, eats a variety of fruits and vegetables daily, limits salt, fat/cholesterol, sugar, caffeine, drinks 6-8 glasses of water daily.  Goals    . Patient Stated     Maintain current health status, continue to eat healthy, exercise, enjoy life and family.      Depression Screen PHQ 2/9 Scores 05/15/2017 12/29/2015  PHQ - 2 Score 0 0    Fall Risk Fall Risk  05/15/2017 12/29/2015  Falls in the past year? No No    ICognitive Function: MMSE - Mini Mental State Exam 05/15/2017  Orientation to time 5  Orientation to Place 5  Registration 3  Attention/ Calculation 5  Recall 2  Language- name 2 objects 2  Language- repeat 1  Language- follow 3 step command 3  Language- read & follow direction 1  Write a sentence 1  Copy design 1  Total score 29        Screening Tests Health Maintenance  Topic Date Due  . OPHTHALMOLOGY EXAM  12/01/2016  . FOOT EXAM  12/28/2016  . TETANUS/TDAP  12/12/2017 (Originally 06/[birth year]/1959)  . PNA vac Low Risk Adult (1 of 2 - PCV13) 12/12/2017 (Originally 06/10/2005)  . INFLUENZA VACCINE  01/15/2018 (Originally 12/28/2016)  . DEXA SCAN  05/15/2018 (Originally 06/10/2005)  . HEMOGLOBIN A1C  06/14/2017     Plan:    Continue doing brain stimulating activities (puzzles, reading,  adult coloring books, staying active) to keep memory sharp.   Continue to eat heart healthy diet (full of fruits, vegetables, whole grains, lean protein, water--limit salt, fat, and sugar intake) and increase physical activity as tolerated.  I have personally reviewed and noted the following in the patient's chart:   . Medical and social history . Use of alcohol, tobacco or illicit drugs  . Current medications and supplements . Functional ability and status . Nutritional status . Physical activity . Advanced directives . List of other physicians . Vitals . Screenings to include cognitive, depression, and falls . Referrals and appointments  In addition, I have reviewed and discussed with patient certain preventive protocols, quality metrics, and best practice recommendations. A written personalized care plan for preventive services as well as general preventive health recommendations were provided to patient.     Wanda PlumpJill A Avynn Klassen, RN   05/15/2017    Medical  screening examination/treatment/procedure(s) were performed by non-physician practitioner and as supervising physician I was immediately available for consultation/collaboration. I agree with above. Pincus SanesStacy J Burns, MD

## 2017-05-13 NOTE — Patient Instructions (Addendum)
Have blood work done today.  Follow up in 6 months  Please call and request Dr. Darcus AustinBernstorff to send last eye exam results to Dr. Lawerance BachBurns.  Continue doing brain stimulating activities (puzzles, reading, adult coloring books, staying active) to keep memory sharp.   Continue to eat heart healthy diet (full of fruits, vegetables, whole grains, lean protein, water--limit salt, fat, and sugar intake) and increase physical activity as tolerated.  Ms. Wendy Rogers , Thank you for taking time to come for your Medicare Wellness Visit. I appreciate your ongoing commitment to your health goals. Please review the following plan we discussed and let me know if I can assist you in the future.   These are the goals we discussed: Goals    . Patient Stated     Maintain current health status, continue to eat healthy, exercise, enjoy life and family.       This is a list of the screening recommended for you and due dates:  Health Maintenance  Topic Date Due  . Eye exam for diabetics  12/01/2016  . Complete foot exam   12/28/2016  . Tetanus Vaccine  12/12/2017*  . Pneumonia vaccines (1 of 2 - PCV13) 12/12/2017*  . Flu Shot  01/15/2018*  . DEXA scan (bone density measurement)  05/15/2018*  . Hemoglobin A1C  06/14/2017  *Topic was postponed. The date shown is not the original due date.

## 2017-05-13 NOTE — Progress Notes (Signed)
Subjective:    Patient ID: Wendy Rogers, female    DOB: 05/14/1941, 76 y.o.   MRN: [id number]  HPI She is here for a physical exam.   She has had some skin changes - increased moles.  This may be genetic.  She is never seen dermatology, but would be interested in a skin check.  She has intermittent prickly sensation in her feet.  It occurs when she lays in the bed too long or stands too long.  If she moves around it goes away.   She has arthritis in her right thumb.  The other day it cramped up.  She denies other joint pain.  Medications and allergies reviewed with patient and updated if appropriate.  Patient Active Problem List   Diagnosis Date Noted  . Tingling sensation 07/04/2016  . Foot pain, bilateral 07/04/2016  . Low vitamin B12 level 07/04/2016  . Essential hypertension, benign 12/29/2015  . Diabetes (HCC) 12/29/2015  . Dry eyes 12/29/2015    Current Outpatient Medications on File Prior to Visit  Medication Sig Dispense Refill  . amLODipine (NORVASC) 5 MG tablet Take 1 tablet (5 mg total) by mouth daily. 30 tablet 3  . losartan (COZAAR) 100 MG tablet Take 1 tablet (100 mg total) by mouth daily. 90 tablet 3  . vitamin B-12 (CYANOCOBALAMIN) 1000 MCG tablet Take 1 tablet (1,000 mcg total) by mouth daily.    . metFORMIN (GLUCOPHAGE) 500 MG tablet Take 1 tablet (500 mg total) by mouth daily. With dinner 90 tablet 3   No current facility-administered medications on file prior to visit.     Past Medical History:  Diagnosis Date  . Diabetes mellitus without complication (HCC)   . Hypertension     Past Surgical History:  Procedure Laterality Date  . ABDOMINAL HYSTERECTOMY     fibroids  . CARPAL TUNNEL RELEASE Bilateral   . CHOLECYSTECTOMY      Social History   Socioeconomic History  . Marital status: Widowed    Spouse name: None  . Number of children: None  . Years of education: None  . Highest education level: None  Social Needs  . Financial resource  strain: None  . Food insecurity - worry: None  . Food insecurity - inability: None  . Transportation needs - medical: None  . Transportation needs - non-medical: None  Occupational History  . None  Tobacco Use  . Smoking status: Never Smoker  . Smokeless tobacco: Never Used  Substance and Sexual Activity  . Alcohol use: Yes    Comment: occasional  . Drug use: No  . Sexual activity: None  Other Topics Concern  . None  Social History Narrative   Exercise: water aerobics 3 /week    Family History  Problem Relation Age of Onset  . Diabetes Mother   . Pancreatic cancer Sister     Review of Systems  Constitutional: Negative for chills and fever.  Eyes: Negative for visual disturbance.  Respiratory: Negative for cough, shortness of breath and wheezing.   Cardiovascular: Positive for leg swelling (occ). Negative for chest pain and palpitations.  Gastrointestinal: Positive for diarrhea (from certain foods). Negative for abdominal pain, blood in stool, constipation and nausea.  Genitourinary: Negative for dysuria and hematuria.  Musculoskeletal: Positive for arthralgias (r thumb). Negative for back pain.  Skin: Positive for color change.  Neurological: Negative for light-headedness and headaches.  Psychiatric/Behavioral: Negative for dysphoric mood. The patient is not nervous/anxious.  Objective:   Vitals:   05/15/17 0909  BP: (!) 142/84  Pulse: 85  Resp: 16  Temp: 97.9 F (36.6 C)  SpO2: 96%   Filed Weights   05/15/17 0909  Weight: 165 lb (74.8 kg)   Body mass index is 33.33 kg/m.  Wt Readings from Last 3 Encounters:  05/15/17 165 lb (74.8 kg)  12/12/16 166 lb (75.3 kg)  08/04/16 168 lb (76.2 kg)     Physical Exam Constitutional: She appears well-developed and well-nourished. No distress.  HENT:  Head: Normocephalic and atraumatic.  Right Ear: External ear normal. Normal ear canal and TM Left Ear: External ear normal.  Normal ear canal and  TM Mouth/Throat: Oropharynx is clear and moist.  Eyes: Conjunctivae and EOM are normal.  Neck: Neck supple. No tracheal deviation present. No thyromegaly present.  No carotid bruit  Cardiovascular: Normal rate, regular rhythm and normal heart sounds.   No murmur heard.  No edema. Pulmonary/Chest: Effort normal and breath sounds normal. No respiratory distress. She has no wheezes. She has no rales.  Breast: deferred Abdominal: Soft. She exhibits no distension. There is no tenderness.  Lymphadenopathy: She has no cervical adenopathy.  Skin: Skin is warm and dry. She is not diaphoretic.  several dark freckles/moles on face, hyperpigmented areas on arms Psychiatric: She has a normal mood and affect. Her behavior is normal.     Foot exam to be completed at wellness visit.     Assessment & Plan:   Physical exam: Screening blood work  ordered Immunizations   - deferred all Colonoscopy no longer needed due to age Mammogram  Up to date  - last done 07/2016 Dexa-discussed.  She declined having a bone density at this time Eye exams    up-to-date-we will request report EKG  None on file Exercise she is exercising regularly Weight   -advised weight loss.  She has lost some weight since she was here last Skin-concerned about increasing moles.  Will refer to dermatology for complete skin evaluation Substance abuse   none  See Problem List for Assessment and Plan of chronic medical problems.  Follow-up in 6 months, sooner if needed

## 2017-05-15 ENCOUNTER — Encounter: Payer: Self-pay | Admitting: Internal Medicine

## 2017-05-15 ENCOUNTER — Ambulatory Visit: Payer: Medicare PPO | Admitting: Internal Medicine

## 2017-05-15 ENCOUNTER — Other Ambulatory Visit (INDEPENDENT_AMBULATORY_CARE_PROVIDER_SITE_OTHER): Payer: Medicare PPO

## 2017-05-15 VITALS — BP 142/84 | HR 85 | Temp 97.9°F | Resp 16 | Ht 59.0 in | Wt 165.0 lb

## 2017-05-15 DIAGNOSIS — E538 Deficiency of other specified B group vitamins: Secondary | ICD-10-CM

## 2017-05-15 DIAGNOSIS — L989 Disorder of the skin and subcutaneous tissue, unspecified: Secondary | ICD-10-CM | POA: Insufficient documentation

## 2017-05-15 DIAGNOSIS — I10 Essential (primary) hypertension: Secondary | ICD-10-CM | POA: Diagnosis not present

## 2017-05-15 DIAGNOSIS — Z0001 Encounter for general adult medical examination with abnormal findings: Secondary | ICD-10-CM | POA: Diagnosis not present

## 2017-05-15 DIAGNOSIS — Z0000 Encounter for general adult medical examination without abnormal findings: Secondary | ICD-10-CM

## 2017-05-15 DIAGNOSIS — E119 Type 2 diabetes mellitus without complications: Secondary | ICD-10-CM | POA: Diagnosis not present

## 2017-05-15 LAB — CBC WITH DIFFERENTIAL/PLATELET
BASOS ABS: 0 10*3/uL (ref 0.0–0.1)
Basophils Relative: 0.7 % (ref 0.0–3.0)
EOS ABS: 0 10*3/uL (ref 0.0–0.7)
EOS PCT: 0.6 % (ref 0.0–5.0)
HCT: 37.3 % (ref 36.0–46.0)
HEMOGLOBIN: 12 g/dL (ref 12.0–15.0)
LYMPHS ABS: 2.1 10*3/uL (ref 0.7–4.0)
Lymphocytes Relative: 44.2 % (ref 12.0–46.0)
MCHC: 32.3 g/dL (ref 30.0–36.0)
MCV: 89 fl (ref 78.0–100.0)
MONO ABS: 0.4 10*3/uL (ref 0.1–1.0)
Monocytes Relative: 8.7 % (ref 3.0–12.0)
NEUTROS PCT: 45.8 % (ref 43.0–77.0)
Neutro Abs: 2.2 10*3/uL (ref 1.4–7.7)
Platelets: 253 10*3/uL (ref 150.0–400.0)
RBC: 4.19 Mil/uL (ref 3.87–5.11)
RDW: 14.6 % (ref 11.5–15.5)
WBC: 4.8 10*3/uL (ref 4.0–10.5)

## 2017-05-15 LAB — COMPREHENSIVE METABOLIC PANEL
ALBUMIN: 4.2 g/dL (ref 3.5–5.2)
ALK PHOS: 74 U/L (ref 39–117)
ALT: 23 U/L (ref 0–35)
AST: 23 U/L (ref 0–37)
BILIRUBIN TOTAL: 0.5 mg/dL (ref 0.2–1.2)
BUN: 30 mg/dL — AB (ref 6–23)
CO2: 26 mEq/L (ref 19–32)
CREATININE: 0.93 mg/dL (ref 0.40–1.20)
Calcium: 10 mg/dL (ref 8.4–10.5)
Chloride: 108 mEq/L (ref 96–112)
GFR: 75.2 mL/min (ref >60.00)
GLUCOSE: 100 mg/dL — AB (ref 70–99)
POTASSIUM: 4.1 meq/L (ref 3.5–5.1)
SODIUM: 142 meq/L (ref 135–145)
TOTAL PROTEIN: 7.5 g/dL (ref 6.0–8.3)

## 2017-05-15 LAB — LIPID PANEL
CHOLESTEROL: 183 mg/dL (ref 0–200)
HDL: 60.3 mg/dL (ref >39.00)
LDL CALC: 88 mg/dL (ref 0–99)
NonHDL: 122.54
TRIGLYCERIDES: 171 mg/dL — AB (ref 0.0–149.0)
Total CHOL/HDL Ratio: 3
VLDL: 34.2 mg/dL (ref 0.0–40.0)

## 2017-05-15 LAB — TSH: TSH: 0.31 u[IU]/mL — AB (ref 0.35–4.50)

## 2017-05-15 LAB — HEMOGLOBIN A1C: HEMOGLOBIN A1C: 5.9 % (ref 4.6–6.5)

## 2017-05-15 LAB — VITAMIN B12: Vitamin B-12: 385 pg/mL (ref 211–911)

## 2017-05-15 NOTE — Assessment & Plan Note (Signed)
Check A1c Continue metformin 500 mg daily Stressed continuing regular exercise Advised weight loss Eye exams up-to-date-we will obtain report

## 2017-05-15 NOTE — Assessment & Plan Note (Signed)
?   Taking B12 Will check level 

## 2017-05-15 NOTE — Assessment & Plan Note (Addendum)
BP Readings from Last 3 Encounters:  05/15/17 (!) 142/84  12/12/16 (!) 172/84  08/04/16 (!) 172/88   Bp elevated here minimally and seems to be fairly stable at home We will continue current medications and monitor Exercising Advised her to work on weight loss Continue low-sodium diet Check cmp, tsh, CBC

## 2017-05-15 NOTE — Assessment & Plan Note (Signed)
Increasing moles Will refer to dermatology for further evaluation

## 2017-05-17 ENCOUNTER — Other Ambulatory Visit: Payer: Self-pay | Admitting: Emergency Medicine

## 2017-05-17 ENCOUNTER — Encounter: Payer: Self-pay | Admitting: Emergency Medicine

## 2017-05-17 DIAGNOSIS — R7989 Other specified abnormal findings of blood chemistry: Secondary | ICD-10-CM

## 2017-05-19 ENCOUNTER — Encounter: Payer: Self-pay | Admitting: *Deleted

## 2017-05-19 ENCOUNTER — Other Ambulatory Visit: Payer: Self-pay | Admitting: *Deleted

## 2017-05-19 NOTE — Patient Outreach (Signed)
[facility] Care Management  05/19/2017  Wendy Rogers 01-10-1941 [id number]  Telephone Screen  Referral Date: 05/18/17 Referral Source: [name] Referral Reason:"Having financial difficulty with pay for essential bills, medications and food" Insurance: [employer]   Outreach attempt # 1 attempt to patient. HIPAA verified with patient. Patient reported, [place] with [place]. Patient had a wellness visit on 12/171/8. She doesn't know why the referral was initiated. She stated, she doesn't need assistance with her finances or medications. Patient stated, she is able to afford all of her medications and takes all of her medications as prescribed. [facility] services and benefits explained to patient. Patient agreed to receive [facility] Pamphlet and Brochure.  Plan: RM CM will send successful outreach letter to patient. RN CM advised patient to contact RN CM for any needs or concerns. RN CM will notify [facility] Case Management Assistant regarding case closure.   Wendy ClevelandJuanita Latonya Knight, RN, BSN, MHA/MSL, [facility] Telephonic Care Manager Coordinator [facility] Direct Phone: [phone] Toll Free: [phone] Fax: [phone]

## 2017-05-19 NOTE — Telephone Encounter (Signed)
This encounter was created in error - please disregard.

## 2017-06-21 NOTE — Progress Notes (Signed)
Subjective:    Patient ID: Wendy Rogers, female    DOB: July 22, 1940, 77 y.o.   MRN: [id number]  HPI She is here for an acute visit for left ear symptoms.  Her symptoms started 2 weeks ago  She is experiencing left ear discomfort.  The pain is intermittent.  Sometimes it hurts at night when she goes to bed.  She has intermittent ringing.  She has a feeling of a bubble in her ear.  She has been putting in ear drops - natural for ringing and she thinks that has helped.   When she chews and yawns it makes her ear feel better.  She denies anything that makes her symptoms worse -- it just comes and goes.  She denies grinding or clenching of her jaw.   Medications and allergies reviewed with patient and updated if appropriate.  Patient Active Problem List   Diagnosis Date Noted  . Skin abnormalities 05/15/2017  . Tingling sensation 07/04/2016  . Foot pain, bilateral 07/04/2016  . Low vitamin B12 level 07/04/2016  . Essential hypertension, benign 12/29/2015  . Diabetes (HCC) 12/29/2015  . Dry eyes 12/29/2015    Current Outpatient Medications on File Prior to Visit  Medication Sig Dispense Refill  . amLODipine (NORVASC) 5 MG tablet Take 1 tablet (5 mg total) by mouth daily. 30 tablet 3  . losartan (COZAAR) 100 MG tablet Take 1 tablet (100 mg total) by mouth daily. 90 tablet 3  . vitamin B-12 (CYANOCOBALAMIN) 1000 MCG tablet Take 1 tablet (1,000 mcg total) by mouth daily.    . metFORMIN (GLUCOPHAGE) 500 MG tablet Take 1 tablet (500 mg total) by mouth daily. With dinner 90 tablet 3   No current facility-administered medications on file prior to visit.     Past Medical History:  Diagnosis Date  . Diabetes mellitus without complication (HCC)   . Hypertension     Past Surgical History:  Procedure Laterality Date  . ABDOMINAL HYSTERECTOMY     fibroids  . CARPAL TUNNEL RELEASE Bilateral   . CHOLECYSTECTOMY      Social History   Socioeconomic History  . Marital status:  Widowed    Spouse name: None  . Number of children: None  . Years of education: None  . Highest education level: None  Social Needs  . Financial resource strain: None  . Food insecurity - worry: None  . Food insecurity - inability: None  . Transportation needs - medical: None  . Transportation needs - non-medical: None  Occupational History  . None  Tobacco Use  . Smoking status: Never Smoker  . Smokeless tobacco: Never Used  Substance and Sexual Activity  . Alcohol use: Yes    Comment: occasional  . Drug use: No  . Sexual activity: None  Other Topics Concern  . None  Social History Narrative   Exercise: water aerobics 3 /week    Family History  Problem Relation Age of Onset  . Diabetes Mother   . Pancreatic cancer Sister     Review of Systems  Constitutional: Negative for chills and fever.  HENT: Positive for ear pain (left ear, intermittent), hearing loss and tinnitus (intermittent). Negative for congestion, sinus pressure, sinus pain and sore throat.   Neurological: Negative for dizziness, light-headedness and headaches.       Objective:   Vitals:   06/22/17 1046  BP: (!) 150/82  Pulse: (!) 105  Resp: 16  Temp: 98.2 F (36.8 C)  SpO2: 96%   Filed  Weights   06/22/17 1046  Weight: 166 lb (75.3 kg)   Body mass index is 33.53 kg/m.  Wt Readings from Last 3 Encounters:  06/22/17 166 lb (75.3 kg)  05/15/17 165 lb (74.8 kg)  12/12/16 166 lb (75.3 kg)     Physical Exam GENERAL APPEARANCE: Appears stated age, well appearing, NAD EYES: conjunctiva clear, no icterus HEENT: bilateral tympanic membranes and ear canals normal, oropharynx with no erythema, no thyromegaly, trachea midline, no cervical or supraclavicular lymphadenopathy; no TMJ tenderness SKIN: warm, dry        Assessment & Plan:   See Problem List for Assessment and Plan of chronic medical problems.

## 2017-06-22 ENCOUNTER — Encounter: Payer: Self-pay | Admitting: Internal Medicine

## 2017-06-22 ENCOUNTER — Ambulatory Visit: Payer: Medicare PPO | Admitting: Internal Medicine

## 2017-06-22 VITALS — BP 150/82 | HR 105 | Temp 98.2°F | Resp 16 | Wt 166.0 lb

## 2017-06-22 DIAGNOSIS — H9312 Tinnitus, left ear: Secondary | ICD-10-CM

## 2017-06-22 DIAGNOSIS — H9202 Otalgia, left ear: Secondary | ICD-10-CM | POA: Diagnosis not present

## 2017-06-22 DIAGNOSIS — R7989 Other specified abnormal findings of blood chemistry: Secondary | ICD-10-CM | POA: Insufficient documentation

## 2017-06-22 DIAGNOSIS — E0590 Thyrotoxicosis, unspecified without thyrotoxic crisis or storm: Secondary | ICD-10-CM | POA: Insufficient documentation

## 2017-06-22 MED ORDER — LOSARTAN POTASSIUM 100 MG PO TABS: 100.0000 mg | ORAL_TABLET | Freq: Every day | ORAL | 3 refills | Status: DC

## 2017-06-22 MED ORDER — METFORMIN HCL 500 MG PO TABS: 500.0000 mg | ORAL_TABLET | Freq: Every day | ORAL | 3 refills | Status: DC

## 2017-06-22 NOTE — Assessment & Plan Note (Signed)
?   Related to hearing loss No excessive cerumen or infection Advised there is no treatment Can continue otc ear drops for ringing it they help

## 2017-06-22 NOTE — Assessment & Plan Note (Signed)
Intermittent Normal exam ? Atypical TMJ No active pain Treat symptomatically If no improvement can refer to ENT

## 2017-06-22 NOTE — Patient Instructions (Signed)
There is no evidence of an ear infection.  You can continue the ear drops if they help.    If your symptoms persist and you would like a referral to ENT let us know.     Your prescriptions were sent to your pharmacy.

## 2017-06-23 ENCOUNTER — Other Ambulatory Visit: Payer: Self-pay | Admitting: Internal Medicine

## 2017-06-30 ENCOUNTER — Other Ambulatory Visit: Payer: Self-pay | Admitting: Internal Medicine

## 2017-06-30 DIAGNOSIS — Z1231 Encounter for screening mammogram for malignant neoplasm of breast: Secondary | ICD-10-CM

## 2017-07-20 ENCOUNTER — Other Ambulatory Visit (INDEPENDENT_AMBULATORY_CARE_PROVIDER_SITE_OTHER): Payer: Medicare PPO

## 2017-07-20 DIAGNOSIS — R7989 Other specified abnormal findings of blood chemistry: Secondary | ICD-10-CM

## 2017-07-20 LAB — TSH: TSH: 0.39 u[IU]/mL (ref 0.35–4.50)

## 2017-08-01 DIAGNOSIS — L814 Other melanin hyperpigmentation: Secondary | ICD-10-CM | POA: Diagnosis not present

## 2017-08-01 DIAGNOSIS — L821 Other seborrheic keratosis: Secondary | ICD-10-CM | POA: Diagnosis not present

## 2017-08-01 DIAGNOSIS — L918 Other hypertrophic disorders of the skin: Secondary | ICD-10-CM | POA: Diagnosis not present

## 2017-08-01 DIAGNOSIS — L720 Epidermal cyst: Secondary | ICD-10-CM | POA: Diagnosis not present

## 2017-08-17 ENCOUNTER — Ambulatory Visit
Admission: RE | Admit: 2017-08-17 | Discharge: 2017-08-17 | Disposition: A | Discharge status: Home / Self Care | Payer: Medicare PPO | Source: Ambulatory Visit | Attending: Internal Medicine | Admitting: Internal Medicine

## 2017-08-17 DIAGNOSIS — Z1231 Encounter for screening mammogram for malignant neoplasm of breast: Secondary | ICD-10-CM | POA: Diagnosis not present

## 2017-09-22 DIAGNOSIS — H524 Presbyopia: Secondary | ICD-10-CM | POA: Diagnosis not present

## 2017-09-22 DIAGNOSIS — H11153 Pinguecula, bilateral: Secondary | ICD-10-CM | POA: Diagnosis not present

## 2017-09-22 DIAGNOSIS — H52221 Regular astigmatism, right eye: Secondary | ICD-10-CM | POA: Diagnosis not present

## 2017-09-22 DIAGNOSIS — H5203 Hypermetropia, bilateral: Secondary | ICD-10-CM | POA: Diagnosis not present

## 2017-09-22 DIAGNOSIS — H40012 Open angle with borderline findings, low risk, left eye: Secondary | ICD-10-CM | POA: Diagnosis not present

## 2017-09-22 DIAGNOSIS — H2513 Age-related nuclear cataract, bilateral: Secondary | ICD-10-CM | POA: Diagnosis not present

## 2017-09-22 DIAGNOSIS — E119 Type 2 diabetes mellitus without complications: Secondary | ICD-10-CM | POA: Diagnosis not present

## 2017-09-22 DIAGNOSIS — H04123 Dry eye syndrome of bilateral lacrimal glands: Secondary | ICD-10-CM | POA: Diagnosis not present

## 2017-09-22 DIAGNOSIS — H18413 Arcus senilis, bilateral: Secondary | ICD-10-CM | POA: Diagnosis not present

## 2017-09-22 LAB — HM DIABETES EYE EXAM

## 2017-09-29 ENCOUNTER — Encounter: Payer: Self-pay | Admitting: Internal Medicine

## 2017-10-16 DIAGNOSIS — H43813 Vitreous degeneration, bilateral: Secondary | ICD-10-CM | POA: Diagnosis not present

## 2017-10-16 DIAGNOSIS — H2513 Age-related nuclear cataract, bilateral: Secondary | ICD-10-CM | POA: Diagnosis not present

## 2017-11-02 DIAGNOSIS — H2511 Age-related nuclear cataract, right eye: Secondary | ICD-10-CM | POA: Diagnosis not present

## 2017-11-12 NOTE — Progress Notes (Signed)
Subjective:    Patient ID: Wendy Rogers, female    DOB: 10-18-1940, 77 y.o.   MRN: [id number]  HPI The patient is here for follow up.  She had cataract surgery 6/6 and the other eye soon.    Hypertension: She is taking her medication daily, but ran out of her medication three days ago. She is not always compliant with a low sodium diet.  She has mild b/l ankle edema at times.  She denies chest pain, palpitations, shortness of breath and regular headaches. She is not exercising regularly.  She does monitor her blood pressure at home, 145-150/?.    Diabetes: She is taking her medication daily as prescribed. She is compliant with a diabetic diet. She is not exercising regularly.   She is up-to-date with an ophthalmology examination.   B12 deficiency;  She is taking vitamin B12 daily.    Low tsh in the past:  She denies any changes in energy level or weight.   Medications and allergies reviewed with patient and updated if appropriate.  Patient Active Problem List   Diagnosis Date Noted  . Ear discomfort, left 06/22/2017  . Ringing in ear, left 06/22/2017  . Low TSH level 06/22/2017  . Skin abnormalities 05/15/2017  . Tingling sensation 07/04/2016  . Foot pain, bilateral 07/04/2016  . Low vitamin B12 level 07/04/2016  . Essential hypertension, benign 12/29/2015  . Diabetes (HCC) 12/29/2015  . Dry eyes 12/29/2015    Current Outpatient Medications on File Prior to Visit  Medication Sig Dispense Refill  . losartan (COZAAR) 100 MG tablet Take 1 tablet (100 mg total) by mouth daily. 90 tablet 3  . vitamin B-12 (CYANOCOBALAMIN) 1000 MCG tablet Take 1 tablet (1,000 mcg total) by mouth daily.    . metFORMIN (GLUCOPHAGE) 500 MG tablet Take 1 tablet (500 mg total) by mouth daily for 1 dose. With dinner 90 tablet 3   No current facility-administered medications on file prior to visit.     Past Medical History:  Diagnosis Date  . Diabetes mellitus without complication (HCC)   .  Hypertension     Past Surgical History:  Procedure Laterality Date  . ABDOMINAL HYSTERECTOMY     fibroids  . CARPAL TUNNEL RELEASE Bilateral   . CHOLECYSTECTOMY      Social History   Socioeconomic History  . Marital status: Widowed    Spouse name: Not on file  . Number of children: Not on file  . Years of education: Not on file  . Highest education level: Not on file  Occupational History  . Not on file  Social Needs  . Financial resource strain: Not on file  . Food insecurity:    Worry: Not on file    Inability: Not on file  . Transportation needs:    Medical: Not on file    Non-medical: Not on file  Tobacco Use  . Smoking status: Never Smoker  . Smokeless tobacco: Never Used  Substance and Sexual Activity  . Alcohol use: Yes    Comment: occasional  . Drug use: No  . Sexual activity: Not on file  Lifestyle  . Physical activity:    Days per week: Not on file    Minutes per session: Not on file  . Stress: Not on file  Relationships  . Social connections:    Talks on phone: Not on file    Gets together: Not on file    Attends religious service: Not on file  Active member of club or organization: Not on file    Attends meetings of clubs or organizations: Not on file    Relationship status: Not on file  Other Topics Concern  . Not on file  Social History Narrative   Exercise: water aerobics 3 /week    Family History  Problem Relation Age of Onset  . Diabetes Mother   . Pancreatic cancer Sister     Review of Systems  Constitutional: Negative for chills and fever.  Respiratory: Negative for cough, shortness of breath and wheezing.   Cardiovascular: Positive for leg swelling (mild). Negative for chest pain and palpitations.  Neurological: Negative for light-headedness and headaches.       Objective:   Vitals:   11/13/17 0904  BP: (!) 152/68  Pulse: 99  Resp: 16  Temp: 98.7 F (37.1 C)  SpO2: 96%   BP Readings from Last 3 Encounters:    11/13/17 (!) 152/68  06/22/17 (!) 150/82  05/15/17 (!) 142/84   Wt Readings from Last 3 Encounters:  11/13/17 161 lb (73 kg)  06/22/17 166 lb (75.3 kg)  05/15/17 165 lb (74.8 kg)   Body mass index is 32.52 kg/m.   Physical Exam    Constitutional: Appears well-developed and well-nourished. No distress.  HENT:  Head: Normocephalic and atraumatic.  Neck: Neck supple. No tracheal deviation present. No thyromegaly present.  No cervical lymphadenopathy Cardiovascular: Normal rate, regular rhythm and normal heart sounds.   No murmur heard. No carotid bruit .  No edema Pulmonary/Chest: Effort normal and breath sounds normal. No respiratory distress. No has no wheezes. No rales.  Skin: Skin is warm and dry. Not diaphoretic.  Psychiatric: Normal mood and affect. Behavior is normal.      Assessment & Plan:    See Problem List for Assessment and Plan of chronic medical problems.

## 2017-11-12 NOTE — Patient Instructions (Addendum)
  Test(s) ordered today. Your results will be released to MyChart (or called to you) after review, usually within 72hours after test completion. If any changes need to be made, you will be notified at that same time.    Medications reviewed and updated.  Changes include increasing amlodipine to 7.5 mg daily  Your prescription(s) have been submitted to your pharmacy. Please take as directed and contact our office if you believe you are having problem(s) with the medication(s).   Please followup in 3 months

## 2017-11-13 ENCOUNTER — Encounter: Payer: Self-pay | Admitting: Internal Medicine

## 2017-11-13 ENCOUNTER — Ambulatory Visit: Payer: Medicare PPO | Admitting: Internal Medicine

## 2017-11-13 ENCOUNTER — Other Ambulatory Visit (INDEPENDENT_AMBULATORY_CARE_PROVIDER_SITE_OTHER): Payer: Medicare PPO

## 2017-11-13 VITALS — BP 152/68 | HR 99 | Temp 98.7°F | Resp 16 | Wt 161.0 lb

## 2017-11-13 DIAGNOSIS — R7989 Other specified abnormal findings of blood chemistry: Secondary | ICD-10-CM | POA: Diagnosis not present

## 2017-11-13 DIAGNOSIS — E119 Type 2 diabetes mellitus without complications: Secondary | ICD-10-CM | POA: Diagnosis not present

## 2017-11-13 DIAGNOSIS — I10 Essential (primary) hypertension: Secondary | ICD-10-CM | POA: Diagnosis not present

## 2017-11-13 DIAGNOSIS — E538 Deficiency of other specified B group vitamins: Secondary | ICD-10-CM

## 2017-11-13 LAB — TSH: TSH: 0.15 u[IU]/mL — ABNORMAL LOW (ref 0.35–4.50)

## 2017-11-13 LAB — LIPID PANEL
CHOLESTEROL: 167 mg/dL (ref 0–200)
HDL: 50.8 mg/dL (ref >39.00)
LDL Cholesterol: 77 mg/dL (ref 0–99)
NONHDL: 115.7
TRIGLYCERIDES: 192 mg/dL — AB (ref 0.0–149.0)
Total CHOL/HDL Ratio: 3
VLDL: 38.4 mg/dL (ref 0.0–40.0)

## 2017-11-13 LAB — COMPREHENSIVE METABOLIC PANEL
ALBUMIN: 4.2 g/dL (ref 3.5–5.2)
ALK PHOS: 70 U/L (ref 39–117)
ALT: 27 U/L (ref 0–35)
AST: 30 U/L (ref 0–37)
BUN: 22 mg/dL (ref 6–23)
CO2: 28 mEq/L (ref 19–32)
Calcium: 9.7 mg/dL (ref 8.4–10.5)
Chloride: 107 mEq/L (ref 96–112)
Creatinine, Ser: 0.81 mg/dL (ref 0.40–1.20)
GFR: 88.08 mL/min (ref >60.00)
GLUCOSE: 91 mg/dL (ref 70–99)
POTASSIUM: 3.5 meq/L (ref 3.5–5.1)
Sodium: 144 mEq/L (ref 135–145)
TOTAL PROTEIN: 7.4 g/dL (ref 6.0–8.3)
Total Bilirubin: 0.6 mg/dL (ref 0.2–1.2)

## 2017-11-13 LAB — HEMOGLOBIN A1C: HEMOGLOBIN A1C: 6 % (ref 4.6–6.5)

## 2017-11-13 LAB — VITAMIN B12: Vitamin B-12: 376 pg/mL (ref 211–911)

## 2017-11-13 MED ORDER — AMLODIPINE BESYLATE 5 MG PO TABS: 5.0000 mg | ORAL_TABLET | Freq: Every day | ORAL | 5 refills | Status: DC

## 2017-11-13 MED ORDER — AMLODIPINE BESYLATE 5 MG PO TABS: 7.5000 mg | ORAL_TABLET | Freq: Every day | ORAL | 5 refills | Status: DC

## 2017-11-13 NOTE — Assessment & Plan Note (Signed)
tsh

## 2017-11-13 NOTE — Assessment & Plan Note (Signed)
Check a1c Low sugar / carb diet Stressed regular exercise   

## 2017-11-13 NOTE — Assessment & Plan Note (Addendum)
Goal BP < 140/90 Does not want to add a third medication Increase amlodipine to 7.5 mg daily Continue losartan 100 mg daily Encouraged low sodium diet and regular exercise Cmp, tsh

## 2017-11-13 NOTE — Assessment & Plan Note (Signed)
Taking b12 - continue Check B12 level

## 2017-12-03 DIAGNOSIS — H2512 Age-related nuclear cataract, left eye: Secondary | ICD-10-CM | POA: Diagnosis not present

## 2017-12-07 DIAGNOSIS — H2512 Age-related nuclear cataract, left eye: Secondary | ICD-10-CM | POA: Diagnosis not present

## 2018-01-02 DIAGNOSIS — Z961 Presence of intraocular lens: Secondary | ICD-10-CM | POA: Diagnosis not present

## 2018-02-14 ENCOUNTER — Ambulatory Visit: Payer: Self-pay

## 2018-02-14 NOTE — Telephone Encounter (Signed)
Patient called in with c/o "chest pain." She says "it started 2 weeks ago. I noticed it when I was exercising, so I stopped for a few days and it was still there. It is a 6, dull pain and it comes and goes. It's across my chest under my breasts from side to side and it goes to my upper back." I asked about other symptoms, she denies. According to protocol, see PCP within 3 days, appointment scheduled for tomorrow, 02/14/18 at 0845 with Dr. Lawerance BachBurns, care advice given, patient verbalized understanding.   Reason for Disposition . [1] Chest pain lasting <= 5 minutes AND [2] has not taken prescribed nitroglycerin  Answer Assessment - Initial Assessment Questions 1. LOCATION: "Where does it hurt?"       Starts on one side to the other side underneath both breast 2. RADIATION: "Does the pain go anywhere else?" (e.g., into neck, jaw, arms, back)     Goes to upper back 3. ONSET: "When did the chest pain begin?" (Minutes, hours or days)      2 weeks ago 4. PATTERN "Does the pain come and go, or has it been constant since it started?"  "Does it get worse with exertion?"      Come and go, no particular time frame  5. DURATION: "How long does it last" (e.g., seconds, minutes, hours)     I don't know 6. SEVERITY: "How bad is the pain?"  (e.g., Scale 1-10; mild, moderate, or severe)    - MILD (1-3): doesn't interfere with normal activities     - MODERATE (4-7): interferes with normal activities or awakens from sleep    - SEVERE (8-10): excruciating pain, unable to do any normal activities       6-dull pain 7. CARDIAC RISK FACTORS: "Do you have any history of heart problems or risk factors for heart disease?" (e.g., prior heart attack, angina; high blood pressure, diabetes, being overweight, high cholesterol, smoking, or strong family history of heart disease)     HTN, overweight 8. PULMONARY RISK FACTORS: "Do you have any history of lung disease?"  (e.g., blood clots in lung, asthma, emphysema, birth control  pills)     No 9. CAUSE: "What do you think is causing the chest pain?"     I thought maybe exercise 10. OTHER SYMPTOMS: "Do you have any other symptoms?" (e.g., dizziness, nausea, vomiting, sweating, fever, difficulty breathing, cough)       No 11. PREGNANCY: "Is there any chance you are pregnant?" "When was your last menstrual period?"       No  Protocols used: CHEST PAIN-A-AH

## 2018-02-14 NOTE — Patient Instructions (Addendum)
  Tests ordered today. Your results will be released to MyChart (or called to you) after review, usually within 72hours after test completion. If any changes need to be made, you will be notified at that same time.  Medications reviewed and updated.  Changes include :   none    A referral was ordered for cardiology

## 2018-02-14 NOTE — Progress Notes (Signed)
Subjective:    Patient ID: Wendy Rogers, female    DOB: 25-Oct-1940, 77 y.o.   MRN: [id number]  HPI The patient is here for an acute visit.  Chest pain, back pain:  It started two weeks ago.  She had cataract surgery a couple of weeks ago and the anesthesiologist heard a sounds - ? Murmur.  She has had intermittent pain in her back, side and around her lower chest since then.  The pain is located in her right side, right shoulder blade and around her lower chest.   The discomfort is sometimes sharp, but mostly dull.  it occurs with movement.   She will take an aspirin and it works.    She had one episode of heaviness in her chest, but denies chest pain.  The other day she was walking her dog - when she was done she felt more tired than usual and had heaviness that lasted 2-3 min and went away on its own.  She walks her dog three times a day and only had that feeling once.  She denies any palpitations, leg swelling, shortness of breath, headaches or lightheadedness.  She has gained weight and is unsure if that is related to her symptoms.  Besides walking the dog she is not doing any other exercise.  Low tsh: She denies any changes in energy level.  She states she is always cold.   Medications and allergies reviewed with patient and updated if appropriate.  Patient Active Problem List   Diagnosis Date Noted  . Ear discomfort, left 06/22/2017  . Ringing in ear, left 06/22/2017  . Low TSH level 06/22/2017  . Skin abnormalities 05/15/2017  . Tingling sensation 07/04/2016  . Foot pain, bilateral 07/04/2016  . Low vitamin B12 level 07/04/2016  . Essential hypertension, benign 12/29/2015  . Diabetes (HCC) 12/29/2015  . Dry eyes 12/29/2015    Current Outpatient Medications on File Prior to Visit  Medication Sig Dispense Refill  . amLODipine (NORVASC) 5 MG tablet Take 1.5 tablets (7.5 mg total) by mouth daily. 45 tablet 5  . losartan (COZAAR) 100 MG tablet Take 1 tablet (100 mg total)  by mouth daily. 90 tablet 3  . vitamin B-12 (CYANOCOBALAMIN) 1000 MCG tablet Take 1 tablet (1,000 mcg total) by mouth daily.    . metFORMIN (GLUCOPHAGE) 500 MG tablet Take 1 tablet (500 mg total) by mouth daily for 1 dose. With dinner 90 tablet 3   No current facility-administered medications on file prior to visit.     Past Medical History:  Diagnosis Date  . Diabetes mellitus without complication (HCC)   . Hypertension     Past Surgical History:  Procedure Laterality Date  . ABDOMINAL HYSTERECTOMY     fibroids  . CARPAL TUNNEL RELEASE Bilateral   . CHOLECYSTECTOMY      Social History   Socioeconomic History  . Marital status: Widowed    Spouse name: Not on file  . Number of children: Not on file  . Years of education: Not on file  . Highest education level: Not on file  Occupational History  . Not on file  Social Needs  . Financial resource strain: Not on file  . Food insecurity:    Worry: Not on file    Inability: Not on file  . Transportation needs:    Medical: Not on file    Non-medical: Not on file  Tobacco Use  . Smoking status: Never Smoker  . Smokeless tobacco: Never Used  Substance and Sexual Activity  . Alcohol use: Yes    Comment: occasional  . Drug use: No  . Sexual activity: Not on file  Lifestyle  . Physical activity:    Days per week: Not on file    Minutes per session: Not on file  . Stress: Not on file  Relationships  . Social connections:    Talks on phone: Not on file    Gets together: Not on file    Attends religious service: Not on file    Active member of club or organization: Not on file    Attends meetings of clubs or organizations: Not on file    Relationship status: Not on file  Other Topics Concern  . Not on file  Social History Narrative   Exercise: water aerobics 3 /week    Family History  Problem Relation Age of Onset  . Diabetes Mother   . Pancreatic cancer Sister     Review of Systems  Constitutional: Negative  for fever.  Respiratory: Negative for cough, shortness of breath and wheezing.   Cardiovascular: Positive for chest pain. Negative for palpitations and leg swelling.  Musculoskeletal: Positive for back pain.  Neurological: Negative for dizziness, light-headedness and headaches.       Objective:   Vitals:   02/15/18 0843  BP: 140/88  Pulse: 69  Resp: 16  Temp: 98.2 F (36.8 C)  SpO2: 96%   BP Readings from Last 3 Encounters:  02/15/18 140/88  11/13/17 (!) 152/68  06/22/17 (!) 150/82   Wt Readings from Last 3 Encounters:  02/15/18 164 lb (74.4 kg)  11/13/17 161 lb (73 kg)  06/22/17 166 lb (75.3 kg)   Body mass index is 33.12 kg/m.   Physical Exam  Constitutional: She appears well-developed and well-nourished.  HENT:  Head: Normocephalic and atraumatic.  Cardiovascular: Normal rate and regular rhythm.  No murmur heard. Pulmonary/Chest: Effort normal and breath sounds normal.  Abdominal: Soft. There is no tenderness.  Musculoskeletal:       Right lower leg: She exhibits no edema.       Left lower leg: She exhibits no edema.  No tenderness right mid back where she experiences the pain, but with certain movements she is able to reproduce the pain.  Tenderness with palpation across lower ribs under bra.  Skin: Skin is warm and dry.        Assessment & Plan:    See Problem List for Assessment and Plan of chronic medical problems.

## 2018-02-15 ENCOUNTER — Ambulatory Visit: Payer: Medicare PPO | Admitting: Internal Medicine

## 2018-02-15 ENCOUNTER — Encounter: Payer: Self-pay | Admitting: Internal Medicine

## 2018-02-15 ENCOUNTER — Other Ambulatory Visit (INDEPENDENT_AMBULATORY_CARE_PROVIDER_SITE_OTHER): Payer: Medicare PPO

## 2018-02-15 VITALS — BP 140/88 | HR 69 | Temp 98.2°F | Resp 16 | Ht 59.0 in | Wt 164.0 lb

## 2018-02-15 DIAGNOSIS — M546 Pain in thoracic spine: Secondary | ICD-10-CM | POA: Diagnosis not present

## 2018-02-15 DIAGNOSIS — R0789 Other chest pain: Secondary | ICD-10-CM | POA: Diagnosis not present

## 2018-02-15 DIAGNOSIS — R7989 Other specified abnormal findings of blood chemistry: Secondary | ICD-10-CM

## 2018-02-15 LAB — T3, FREE: T3 FREE: 2.9 pg/mL (ref 2.3–4.2)

## 2018-02-15 LAB — TSH: TSH: 0.48 u[IU]/mL (ref 0.35–4.50)

## 2018-02-15 LAB — T4, FREE: Free T4: 0.79 ng/dL (ref 0.60–1.60)

## 2018-02-15 NOTE — Assessment & Plan Note (Signed)
Intermittent right thoracic back pain that tends to come with movement, also having tenderness along lower ribs underneath her bra strap, which is likely from the pressure of the bra Likely musculoskeletal in nature and not related to her episode of chest heaviness Symptomatic treatment Monitor and she will let me know if it worsens

## 2018-02-15 NOTE — Assessment & Plan Note (Signed)
Recent TSH low and has been low in the past Check TFTs and thyroid antibodies Currently not having any thyroid symptoms

## 2018-02-15 NOTE — Assessment & Plan Note (Signed)
EKG not able to be performed today because EKG machines were not working She only had one episode of chest heaviness, but given risk factors would like her to have further evaluation Will refer to cardiology

## 2018-02-16 LAB — THYROID ANTIBODIES: Thyroperoxidase Ab SerPl-aCnc: 2 IU/mL (ref <9)

## 2018-02-19 ENCOUNTER — Encounter: Payer: Medicare PPO | Admitting: Internal Medicine

## 2018-02-19 VITALS — Ht 59.0 in

## 2018-02-26 ENCOUNTER — Encounter: Payer: Self-pay | Admitting: Cardiology

## 2018-02-26 ENCOUNTER — Ambulatory Visit: Payer: Medicare PPO | Admitting: Cardiology

## 2018-02-26 VITALS — BP 176/86 | HR 94 | Ht 59.0 in | Wt 162.0 lb

## 2018-02-26 DIAGNOSIS — I10 Essential (primary) hypertension: Secondary | ICD-10-CM | POA: Diagnosis not present

## 2018-02-26 DIAGNOSIS — E119 Type 2 diabetes mellitus without complications: Secondary | ICD-10-CM

## 2018-02-26 DIAGNOSIS — R0789 Other chest pain: Secondary | ICD-10-CM | POA: Diagnosis not present

## 2018-02-26 NOTE — Progress Notes (Signed)
PCP: Pincus Sanes, MD  Clinic Note: Chief Complaint  Patient presents with  . New Patient (Initial Visit)    Chest pain evaluation    HPI: Wendy Rogers is a 77 y.o. female who is being seen today for the evaluation of central right-sided chest pain at the request of Pincus Sanes, MD.  Wendy Rogers was seen on Sept 19, 2019 --> she noted having right-sided back pain that was also noted in center of her chest radiating to the right.  She described as a heaviness sensation in the center of the chest that happened shortly after walking her dog.  It lasted less than 10 seconds.  Did not occur while walking the dog.  She is referred for evaluation of chest pain.  Recent Hospitalizations: None  Studies Personally Reviewed - (if available, images/films reviewed: From Epic Chart or Care Everywhere)  None  Interval History: Ms. Wendy Rogers presents here today the to discuss this episode of chest pain noted above.  Other than that short episode of chest heaviness, she has not had any further chest pain.  She walks her dog at least 2-3 times a day may be about a half a mile each time.  If the dog goes fast, she may get little short of breath, but not with routine walking.  She has not ever had chest pain with walking, and only one episode after walking.  She thinks that that may have been a time when the dog was walking more briskly and, pulled on her arm some. She had no symptoms of dyspnea with this episode.  She has not had exertional dyspnea or resting dyspnea. She denies any PND, orthopnea or edema.  No palpitations, lightheadedness, dizziness, weakness or syncope/near syncope. No TIA/amaurosis fugax symptoms. No claudication.  ROS: A comprehensive was performed. Review of Systems  Constitutional: Negative for malaise/fatigue.  HENT: Negative for congestion and nosebleeds.   Eyes: Positive for blurred vision (She is having a little bit of issues with focusing after her recent cataract  surgery).  Respiratory: Negative for cough, shortness of breath and wheezing.   Cardiovascular: Positive for leg swelling (Mild end of day ankle swelling).  Gastrointestinal: Negative for blood in stool and melena.  Genitourinary: Negative for hematuria.  Musculoskeletal: Negative for joint pain.       Occasional toe cramps, happens a lot at night.  Neurological: Negative for dizziness, focal weakness, seizures and loss of consciousness.  Endo/Heme/Allergies: Does not bruise/bleed easily.  Psychiatric/Behavioral: Negative for depression and memory loss. The patient is not nervous/anxious and does not have insomnia.   All other systems reviewed and are negative.    I have reviewed and (if needed) personally updated the patient's problem list, medications, allergies, past medical and surgical history, social and family history.   Past Medical History:  Diagnosis Date  . Diabetes mellitus without complication (HCC)   . Hypertension     Past Surgical History:  Procedure Laterality Date  . ABDOMINAL HYSTERECTOMY     fibroids  . CARPAL TUNNEL RELEASE Bilateral   . CHOLECYSTECTOMY      Current Meds  Medication Sig  . amLODipine (NORVASC) 5 MG tablet Take 1.5 tablets (7.5 mg total) by mouth daily.  Marland Kitchen losartan (COZAAR) 100 MG tablet Take 1 tablet (100 mg total) by mouth daily.  . metFORMIN (GLUCOPHAGE) 500 MG tablet Take 1 tablet (500 mg total) by mouth daily for 1 dose. With dinner  . vitamin B-12 (CYANOCOBALAMIN) 1000 MCG tablet Take 1  tablet (1,000 mcg total) by mouth daily.    No Known Allergies  Social History   Tobacco Use  . Smoking status: Never Smoker  . Smokeless tobacco: Never Used  Substance Use Topics  . Alcohol use: Yes    Comment: occasional  . Drug use: No   Social History   Social History Narrative   She walks her dog routinely.  Exercise: water aerobics 3 /week    family history includes Diabetes in her mother; Pancreatic cancer in her sister.  Wt  Readings from Last 3 Encounters:  02/26/18 162 lb (73.5 kg)  02/15/18 164 lb (74.4 kg)  11/13/17 161 lb (73 kg)    PHYSICAL EXAM BP (!) 176/86   Pulse 94   Ht 4\' 11"  (1.499 m)   Wt 162 lb (73.5 kg)   BMI 32.72 kg/m  Physical Exam  Constitutional: She is oriented to person, place, and time. She appears well-developed and well-nourished. No distress.  HENT:  Head: Normocephalic and atraumatic.  Mouth/Throat: Oropharynx is clear and moist.  Eyes: Conjunctivae and EOM are normal.  Pupils are reactive, mild cataract changes.  not exactly round.  Neck: Normal range of motion. Neck supple. No hepatojugular reflux and no JVD present. Carotid bruit is not present.  Cardiovascular: Normal rate, regular rhythm, normal heart sounds and intact distal pulses.  No extrasystoles are present. PMI is not displaced (Very difficult to palpate). Exam reveals no gallop and no friction rub.  No murmur heard. Pulmonary/Chest: Effort normal and breath sounds normal. No respiratory distress. She has no wheezes. She has no rales.  Abdominal: Soft. Bowel sounds are normal. She exhibits no distension. There is no tenderness. There is no rebound.  No HSM  Musculoskeletal: Normal range of motion. She exhibits no edema.  Neurological: She is alert and oriented to person, place, and time. No cranial nerve deficit.  Psychiatric: She has a normal mood and affect. Her behavior is normal. Judgment and thought content normal.  Vitals reviewed.    Adult ECG Report EKG not done here today. From PCP dated 02/19/2018: Rate: 75 ;  Rhythm: normal sinus rhythm and Normal axis, intervals and durations.  Low voltage.;   Narrative Interpretation: Relatively normal EKG   Other studies Reviewed: Additional studies/ records that were reviewed today include:  Recent Labs:   Lab Results  Component Value Date   CHOL 167 11/13/2017   HDL 50.80 11/13/2017   LDLCALC 77 11/13/2017   TRIG 192.0 (H) 11/13/2017   CHOLHDL 3  11/13/2017   Lab Results  Component Value Date   CREATININE 0.81 11/13/2017   BUN 22 11/13/2017   NA 144 11/13/2017   K 3.5 11/13/2017   CL 107 11/13/2017   CO2 28 11/13/2017    ASSESSMENT / PLAN: Problem List Items Addressed This Visit    Chest heaviness - Primary    One episode of chest heaviness that occurred after exertion and not with exertion.  Does not sound classical for angina, both in the duration and not being associated with activity.  Plan: GXT (Graduated Exercise Treadmill Test) evaluation.      Relevant Orders   EXERCISE TOLERANCE TEST (ETT)   Diabetes (HCC) (Chronic)    She is diabetic like the majority of her family on both sides, she is on metformin only.  With hypertension and diabetes, she does have cardiac risk factors.       Relevant Orders   EXERCISE TOLERANCE TEST (ETT)   Essential hypertension, benign (Chronic)  Quite anxious today.  Blood pressure is pretty high today. Would probably consider increasing amlodipine to 10 mg daily. Is on max dose Cozaar, if additional blood pressure is required, it would probably use a beta-blocker such as carvedilol.         Current medicines are reviewed at length with the patient today.  (+/- concerns) n/a The following changes have been made:  n/a  Patient Instructions  MEDICATION INSTRUCTIONS  NO CHANGES AT PRESENT      SCHEDULE AT [address] Your physician has requested that you have an exercise tolerance test. For further information please visit [email or web address] Please also follow instruction sheet, as given.    Your physician recommends that you schedule a follow-up appointment in 1 MONTH WITH DR Andron Marrazzo. ( IF NEGATIVE, CAN FOLLOW UP ON AN AS NEEDED BASIS)   If you need a refill on your cardiac medications before your next appointment, please call your pharmacy.     Studies Ordered:   Orders Placed This Encounter  Procedures  . EXERCISE TOLERANCE TEST (ETT)       Bryan Lemma, M.D., M.S. Interventional Cardiologist   Pager # [phone] Phone # [phone] [address] [place], [place] [address]   Thank you for choosing [facility]!!

## 2018-02-26 NOTE — Patient Instructions (Addendum)
MEDICATION INSTRUCTIONS  NO CHANGES AT PRESENT      SCHEDULE AT [address] Your physician has requested that you have an exercise tolerance test. For further information please visit [email or web address] Please also follow instruction sheet, as given.    Your physician recommends that you schedule a follow-up appointment in 1 MONTH WITH DR HARDING. ( IF NEGATIVE, CAN FOLLOW UP ON AN AS NEEDED BASIS)   If you need a refill on your cardiac medications before your next appointment, please call your pharmacy.

## 2018-02-27 ENCOUNTER — Encounter: Payer: Self-pay | Admitting: Cardiology

## 2018-02-27 NOTE — Assessment & Plan Note (Signed)
One episode of chest heaviness that occurred after exertion and not with exertion.  Does not sound classical for angina, both in the duration and not being associated with activity.  Plan: GXT (Graduated Exercise Treadmill Test) evaluation.

## 2018-02-27 NOTE — Assessment & Plan Note (Signed)
Quite anxious today.  Blood pressure is pretty high today. Would probably consider increasing amlodipine to 10 mg daily. Is on max dose Cozaar, if additional blood pressure is required, it would probably use a beta-blocker such as carvedilol.

## 2018-02-27 NOTE — Assessment & Plan Note (Signed)
She is diabetic like the majority of her family on both sides, she is on metformin only.  With hypertension and diabetes, she does have cardiac risk factors.

## 2018-04-04 DIAGNOSIS — H903 Sensorineural hearing loss, bilateral: Secondary | ICD-10-CM | POA: Diagnosis not present

## 2018-04-19 ENCOUNTER — Telehealth (HOSPITAL_COMMUNITY): Payer: Self-pay

## 2018-04-19 NOTE — Telephone Encounter (Signed)
Encounter complete. 

## 2018-04-20 ENCOUNTER — Telehealth (HOSPITAL_COMMUNITY): Payer: Self-pay

## 2018-04-20 NOTE — Telephone Encounter (Signed)
Encounter complete. 

## 2018-04-24 ENCOUNTER — Inpatient Hospital Stay (HOSPITAL_COMMUNITY): Admission: RE | Admit: 2018-04-24 | Payer: Medicare PPO | Source: Ambulatory Visit

## 2018-04-30 ENCOUNTER — Encounter: Payer: Self-pay | Admitting: Cardiology

## 2018-05-04 ENCOUNTER — Telehealth: Payer: Self-pay

## 2018-05-04 DIAGNOSIS — R0789 Other chest pain: Secondary | ICD-10-CM

## 2018-05-04 DIAGNOSIS — E119 Type 2 diabetes mellitus without complications: Secondary | ICD-10-CM

## 2018-05-04 DIAGNOSIS — I10 Essential (primary) hypertension: Secondary | ICD-10-CM

## 2018-05-04 NOTE — Telephone Encounter (Signed)
New message     Just an FYI. We have made several attempts to contact this patient including sending a letter to schedule or reschedule their EXERCISE TOLERANCE TEST . We will be removing the patient from the WQ.   Thank you 

## 2018-05-07 NOTE — Telephone Encounter (Signed)
PATIENT CALLED BACK TO RESCHEDULE TEST

## 2018-05-07 NOTE — Addendum Note (Signed)
Addended by: Tobin ChadMARTIN, [place] V on: 05/07/2018 04:18 PM   Modules accepted: Orders

## 2018-05-08 ENCOUNTER — Ambulatory Visit: Payer: Medicare PPO | Admitting: Cardiology

## 2018-05-11 ENCOUNTER — Telehealth (HOSPITAL_COMMUNITY): Payer: Self-pay

## 2018-05-11 NOTE — Telephone Encounter (Signed)
Encounter complete. 

## 2018-05-16 ENCOUNTER — Encounter (HOSPITAL_COMMUNITY): Payer: Medicare PPO

## 2018-05-16 ENCOUNTER — Ambulatory Visit (HOSPITAL_COMMUNITY)
Admission: RE | Admit: 2018-05-16 | Discharge: 2018-05-16 | Disposition: A | Discharge status: Home / Self Care | Payer: Medicare PPO | Source: Ambulatory Visit | Attending: Cardiovascular Disease | Admitting: Cardiovascular Disease

## 2018-05-16 ENCOUNTER — Encounter (HOSPITAL_COMMUNITY): Payer: Self-pay | Admitting: *Deleted

## 2018-05-16 DIAGNOSIS — E119 Type 2 diabetes mellitus without complications: Secondary | ICD-10-CM | POA: Diagnosis not present

## 2018-05-16 DIAGNOSIS — I10 Essential (primary) hypertension: Secondary | ICD-10-CM | POA: Diagnosis not present

## 2018-05-16 DIAGNOSIS — R0789 Other chest pain: Secondary | ICD-10-CM | POA: Insufficient documentation

## 2018-05-16 LAB — EXERCISE TOLERANCE TEST
CHL RATE OF PERCEIVED EXERTION: 19
CSEPED: 4 min
CSEPEDS: 50 s
CSEPEW: 6.7 METS
CSEPPHR: 179 {beats}/min
MPHR: 143 {beats}/min
Percent HR: 125 %
Rest HR: 75 {beats}/min

## 2018-05-16 NOTE — Progress Notes (Unsigned)
Abnormal ETT was reviewed by Dr. Harding. Patient was given the ok to be discharged to go home. 

## 2018-06-22 ENCOUNTER — Ambulatory Visit: Payer: Medicare PPO | Admitting: Cardiology

## 2018-07-11 DIAGNOSIS — E119 Type 2 diabetes mellitus without complications: Secondary | ICD-10-CM | POA: Diagnosis not present

## 2018-07-11 DIAGNOSIS — H04123 Dry eye syndrome of bilateral lacrimal glands: Secondary | ICD-10-CM | POA: Diagnosis not present

## 2018-07-11 DIAGNOSIS — Z961 Presence of intraocular lens: Secondary | ICD-10-CM | POA: Diagnosis not present

## 2018-07-11 LAB — HM DIABETES EYE EXAM

## 2018-07-13 ENCOUNTER — Other Ambulatory Visit: Payer: Self-pay | Admitting: Internal Medicine

## 2018-07-13 DIAGNOSIS — Z1231 Encounter for screening mammogram for malignant neoplasm of breast: Secondary | ICD-10-CM

## 2018-07-17 ENCOUNTER — Encounter: Payer: Self-pay | Admitting: Internal Medicine

## 2018-08-14 NOTE — Progress Notes (Signed)
Subjective:    Patient ID: Wendy Rogers, female    DOB: 26-Oct-1940, 78 y.o.   MRN: [id number]  HPI The patient is here for an acute visit.   Wendy Rogers in feet and knees:  She has intermittent burning in the balls in her feet.  Sometimes she feels tingling and stinging in her lower legs up to her knees.  This started two months ago.  It has not gotten worse.  A couple of years ago she had similar symptoms that resolved spontaneously.  B12 deficiency: She does have history of B12 deficiency.  She is taking B12 on a daily basis.  Diabetes: She is taking her medication daily as prescribed. She is compliant with a diabetic diet. She is exercising regularly - does treadmill and bicycle.  She checks her feet daily and denies foot lesions. She is up-to-date with an ophthalmology examination.   Hypertension: She is taking her medication daily. She is compliant with a low sodium diet.  She denies chest pain, palpitations, shortness of breath and regular headaches. She is exercising regularly.  She does monitor her blood pressure at home - 145-150/?Marland Kitchen  She does feel a little nervous - she drove herself here and she usually doe snot drive herself.     Medications and allergies reviewed with patient and updated if appropriate.  Patient Active Problem List   Diagnosis Date Noted  . Acute right-sided thoracic back pain 02/15/2018  . Chest heaviness 02/15/2018  . Ear discomfort, left 06/22/2017  . Ringing in ear, left 06/22/2017  . Low TSH level 06/22/2017  . Skin abnormalities 05/15/2017  . Tingling sensation 07/04/2016  . Foot pain, bilateral 07/04/2016  . Low vitamin B12 level 07/04/2016  . Essential hypertension, benign 12/29/2015  . Diabetes (HCC) 12/29/2015  . Dry eyes 12/29/2015    Current Outpatient Medications on File Prior to Visit  Medication Sig Dispense Refill  . vitamin B-12 (CYANOCOBALAMIN) 1000 MCG tablet Take 1 tablet (1,000 mcg total) by mouth daily.     No current  facility-administered medications on file prior to visit.     Past Medical History:  Diagnosis Date  . Diabetes mellitus without complication (HCC)   . Hypertension     Past Surgical History:  Procedure Laterality Date  . ABDOMINAL HYSTERECTOMY     fibroids  . CARPAL TUNNEL RELEASE Bilateral   . CHOLECYSTECTOMY      Social History   Socioeconomic History  . Marital status: Widowed    Spouse name: Not on file  . Number of children: Not on file  . Years of education: Not on file  . Highest education level: Not on file  Occupational History  . Not on file  Social Needs  . Financial resource strain: Not on file  . Food insecurity:    Worry: Not on file    Inability: Not on file  . Transportation needs:    Medical: Not on file    Non-medical: Not on file  Tobacco Use  . Smoking status: Never Smoker  . Smokeless tobacco: Never Used  Substance and Sexual Activity  . Alcohol use: Yes    Comment: occasional  . Drug use: No  . Sexual activity: Not on file  Lifestyle  . Physical activity:    Days per week: Not on file    Minutes per session: Not on file  . Stress: Not on file  Relationships  . Social connections:    Talks on phone: Not on file  Gets together: Not on file    Attends religious service: Not on file    Active member of club or organization: Not on file    Attends meetings of clubs or organizations: Not on file    Relationship status: Not on file  Other Topics Concern  . Not on file  Social History Narrative   She walks her dog routinely.  Exercise: water aerobics 3 /week    Family History  Problem Relation Age of Onset  . Diabetes Mother   . Pancreatic cancer Sister     Review of Systems  Constitutional: Negative for chills and fever.  Eyes: Negative for visual disturbance.  Respiratory: Negative for cough, shortness of breath and wheezing.   Cardiovascular: Positive for leg swelling (sometimes lateral ankles). Negative for chest pain and  palpitations.  Neurological: Negative for dizziness, light-headedness and headaches.       Objective:   Vitals:   08/15/18 0942  BP: (!) 192/78  Pulse: 83  Resp: 16  Temp: 98.2 F (36.8 C)  SpO2: 99%   BP Readings from Last 3 Encounters:  08/15/18 (!) 192/78  02/26/18 (!) 176/86  02/15/18 140/88   Wt Readings from Last 3 Encounters:  08/15/18 161 lb 6.4 oz (73.2 kg)  02/26/18 162 lb (73.5 kg)  02/15/18 164 lb (74.4 kg)   Body mass index is 32.6 kg/m.   Physical Exam    Constitutional: Appears well-developed and well-nourished. No distress.  HENT:  Head: Normocephalic and atraumatic.  Neck: Neck supple. No tracheal deviation present. No thyromegaly present.  No cervical lymphadenopathy Cardiovascular: Normal rate, regular rhythm and normal heart sounds.   No murmur heard. No carotid bruit .  No edema Pulmonary/Chest: Effort normal and breath sounds normal. No respiratory distress. No has no wheezes. No rales.  Skin: Skin is warm and dry. Not diaphoretic.  Psychiatric: Normal mood and affect. Behavior is normal.   Diabetic Foot Exam - Simple   Simple Foot Form Diabetic Foot exam was performed with the following findings:  Yes   Visual Inspection No deformities, no ulcerations, no other skin breakdown bilaterally:  Yes Sensation Testing Intact to touch and monofilament testing bilaterally:  Yes Pulse Check Posterior Tibialis and Dorsalis pulse intact bilaterally:  Yes Comments         Assessment & Plan:    See Problem List for Assessment and Plan of chronic medical problems.

## 2018-08-15 ENCOUNTER — Encounter: Payer: Self-pay | Admitting: Internal Medicine

## 2018-08-15 ENCOUNTER — Other Ambulatory Visit: Payer: Self-pay

## 2018-08-15 ENCOUNTER — Other Ambulatory Visit (INDEPENDENT_AMBULATORY_CARE_PROVIDER_SITE_OTHER): Payer: Medicare PPO

## 2018-08-15 ENCOUNTER — Ambulatory Visit (INDEPENDENT_AMBULATORY_CARE_PROVIDER_SITE_OTHER): Payer: Medicare PPO | Admitting: Internal Medicine

## 2018-08-15 VITALS — BP 192/78 | HR 83 | Temp 98.2°F | Resp 16 | Ht 59.0 in | Wt 161.4 lb

## 2018-08-15 DIAGNOSIS — R7989 Other specified abnormal findings of blood chemistry: Secondary | ICD-10-CM | POA: Diagnosis not present

## 2018-08-15 DIAGNOSIS — R202 Paresthesia of skin: Secondary | ICD-10-CM

## 2018-08-15 DIAGNOSIS — E538 Deficiency of other specified B group vitamins: Secondary | ICD-10-CM

## 2018-08-15 DIAGNOSIS — E119 Type 2 diabetes mellitus without complications: Secondary | ICD-10-CM | POA: Diagnosis not present

## 2018-08-15 DIAGNOSIS — E785 Hyperlipidemia, unspecified: Secondary | ICD-10-CM | POA: Insufficient documentation

## 2018-08-15 DIAGNOSIS — I10 Essential (primary) hypertension: Secondary | ICD-10-CM

## 2018-08-15 DIAGNOSIS — E1169 Type 2 diabetes mellitus with other specified complication: Secondary | ICD-10-CM | POA: Insufficient documentation

## 2018-08-15 LAB — HEMOGLOBIN A1C: HEMOGLOBIN A1C: 5.9 % (ref 4.6–6.5)

## 2018-08-15 LAB — CBC WITH DIFFERENTIAL/PLATELET
BASOS PCT: 0.7 % (ref 0.0–3.0)
Basophils Absolute: 0 10*3/uL (ref 0.0–0.1)
EOS ABS: 0 10*3/uL (ref 0.0–0.7)
Eosinophils Relative: 1 % (ref 0.0–5.0)
HCT: 35.4 % — ABNORMAL LOW (ref 36.0–46.0)
Hemoglobin: 11.7 g/dL — ABNORMAL LOW (ref 12.0–15.0)
Lymphocytes Relative: 41.8 % (ref 12.0–46.0)
Lymphs Abs: 2 10*3/uL (ref 0.7–4.0)
MCHC: 33.1 g/dL (ref 30.0–36.0)
MCV: 87.6 fl (ref 78.0–100.0)
MONO ABS: 0.4 10*3/uL (ref 0.1–1.0)
Monocytes Relative: 9 % (ref 3.0–12.0)
NEUTROS PCT: 47.5 % (ref 43.0–77.0)
Neutro Abs: 2.3 10*3/uL (ref 1.4–7.7)
Platelets: 245 10*3/uL (ref 150.0–400.0)
RBC: 4.04 Mil/uL (ref 3.87–5.11)
RDW: 14.4 % (ref 11.5–15.5)
WBC: 4.9 10*3/uL (ref 4.0–10.5)

## 2018-08-15 LAB — COMPREHENSIVE METABOLIC PANEL
ALBUMIN: 4.3 g/dL (ref 3.5–5.2)
ALT: 17 U/L (ref 0–35)
AST: 18 U/L (ref 0–37)
Alkaline Phosphatase: 80 U/L (ref 39–117)
BILIRUBIN TOTAL: 0.5 mg/dL (ref 0.2–1.2)
BUN: 22 mg/dL (ref 6–23)
CHLORIDE: 105 meq/L (ref 96–112)
CO2: 28 meq/L (ref 19–32)
CREATININE: 0.96 mg/dL (ref 0.40–1.20)
Calcium: 10.4 mg/dL (ref 8.4–10.5)
GFR: 67.98 mL/min (ref >60.00)
Glucose, Bld: 101 mg/dL — ABNORMAL HIGH (ref 70–99)
Potassium: 4.1 mEq/L (ref 3.5–5.1)
SODIUM: 142 meq/L (ref 135–145)
Total Protein: 7.6 g/dL (ref 6.0–8.3)

## 2018-08-15 LAB — VITAMIN B12: Vitamin B-12: 562 pg/mL (ref 211–911)

## 2018-08-15 LAB — LIPID PANEL
CHOL/HDL RATIO: 3
Cholesterol: 217 mg/dL — ABNORMAL HIGH (ref 0–200)
HDL: 62.6 mg/dL (ref >39.00)
LDL Cholesterol: 131 mg/dL — ABNORMAL HIGH (ref 0–99)
NONHDL: 154.2
TRIGLYCERIDES: 115 mg/dL (ref 0.0–149.0)
VLDL: 23 mg/dL (ref 0.0–40.0)

## 2018-08-15 LAB — TSH: TSH: 0.71 u[IU]/mL (ref 0.35–4.50)

## 2018-08-15 MED ORDER — METFORMIN HCL 500 MG PO TABS: 500.0000 mg | ORAL_TABLET | Freq: Every day | ORAL | 1 refills | Status: DC

## 2018-08-15 MED ORDER — AMLODIPINE BESYLATE 5 MG PO TABS: 7.5000 mg | ORAL_TABLET | Freq: Every day | ORAL | 1 refills | Status: DC

## 2018-08-15 MED ORDER — LOSARTAN POTASSIUM 100 MG PO TABS: 100.0000 mg | ORAL_TABLET | Freq: Every day | ORAL | 1 refills | Status: DC

## 2018-08-15 MED ORDER — GABAPENTIN 100 MG PO CAPS: 100.0000 mg | ORAL_CAPSULE | Freq: Two times a day (BID) | ORAL | 3 refills | Status: DC

## 2018-08-15 NOTE — Assessment & Plan Note (Signed)
A1c has been well controlled Check A1c today Continue metformin-we will adjust dose if needed Continue regular exercise and diabetic diet

## 2018-08-15 NOTE — Assessment & Plan Note (Signed)
Tingling, stinging sensation in feet and legs Suggestive of neuropathy Has B12 deficiency, but taking B12 daily Will check B12 level Will try gabapentin-start with 100 mg at night and after a couple of nights if tolerated can add 100 mg to the morning.  Discussed possible side effects Discussed neurology referral-she deferred for now, but will let me know if she changes her mind Follow-up in 3 months

## 2018-08-15 NOTE — Patient Instructions (Signed)
  Tests ordered today. Your results will be released to MyChart (or called to you) after review, usually within 72hours after test completion. If any changes need to be made, you will be notified at that same time.   Medications reviewed and updated.  Changes include :   Increase amlodipine to 7.5 mg ( one and a half pills daily).  Monitor your BP at home - if your BP is frequently more than 150 on top please call so we can adjust your medication.    Start gabapentin at night - take one pill at night for a couple of nights.  If tolerated add a pill in the morning as well.  This may cause drowsiness.    Your prescription(s) have been submitted to your pharmacy. Please take as directed and contact our office if you believe you are having problem(s) with the medication(s).  Let me know if you want to see neurology at anytime for further evaluation of your tingling.   Please followup in 3 months

## 2018-08-15 NOTE — Assessment & Plan Note (Signed)
History of low TSH Check TSH

## 2018-08-15 NOTE — Assessment & Plan Note (Signed)
Blood pressure very high here today, but she was anxious, which could be accounting for this-she may have some whitecoat hypertension Blood pressure at home typically 140-150/? She is only taking 5 mg of the amlodipine and should be taking 7.5 mg daily-we will increase to 7.5 mg. Continue losartan 100 mg daily She will monitor her blood pressure at home-advised I want her systolic less than 150 and if not she should call.  We can consider increasing amlodipine to 10 mg daily CMP

## 2018-08-15 NOTE — Assessment & Plan Note (Signed)
Taking B12 daily Check level 

## 2018-08-22 ENCOUNTER — Ambulatory Visit: Payer: Medicare PPO

## 2018-09-21 ENCOUNTER — Ambulatory Visit: Payer: Medicare PPO

## 2018-11-14 NOTE — Patient Instructions (Addendum)
  Tests ordered today. Your results will be released to Yon[facility] (or called to you) after review, usually within 72hours after test completion. If any changes need to be made, you will be notified at that same time.   Medications reviewed and updated.  Changes include :   Start crestor 5 mg twice a week for your cholesterol.  Start bystolic 2.5 mg daily  Your prescription(s) have been submitted to your pharmacy. Please take as directed and contact our office if you believe you are having problem(s) with the medication(s).    Please followup in 4 months

## 2018-11-14 NOTE — Progress Notes (Signed)
Subjective:    Patient ID: Wendy Rogers, female    DOB: 02/16/1941, 78 y.o.   MRN: [id number]  HPI The patient is here for follow up.  She is not exercising regularly.     Diabetes: She is taking her medication daily as prescribed. She is compliant with a diabetic diet.   She is up-to-date with an ophthalmology examination.   Hypertension: She is taking her medication daily. She is compliant with a low sodium diet.  She denies chest pain, palpitations, edema, shortness of breath and regular headaches. She does monitor her blood pressure at home - 150-160's/?   She will feel a little dizzy and has a hard time balancing herself when her BP is elevated.    Tingling in feet, probable neuropathy:  She is taking the gabapentin at night and in the morning if needed.  The medication has helped.     Hyperlipidemia:  She is not on a medication.  She thinks she eats pretty healthy.  She is not exercising regularly.     Medications and allergies reviewed with patient and updated if appropriate.  Patient Active Problem List   Diagnosis Date Noted  . Hyperlipidemia 08/15/2018  . Acute right-sided thoracic back pain 02/15/2018  . Chest heaviness 02/15/2018  . Ear discomfort, left 06/22/2017  . Ringing in ear, left 06/22/2017  . Low TSH level 06/22/2017  . Skin abnormalities 05/15/2017  . Tingling sensation 07/04/2016  . Foot pain, bilateral 07/04/2016  . Low vitamin B12 level 07/04/2016  . Essential hypertension, benign 12/29/2015  . Diabetes ([place]) 12/29/2015  . Dry eyes 12/29/2015    Current Outpatient Medications on File Prior to Visit  Medication Sig Dispense Refill  . amLODipine (NORVASC) 5 MG tablet Take 1.5 tablets (7.5 mg total) by mouth daily. 135 tablet 1  . gabapentin (NEURONTIN) 100 MG capsule Take 1 capsule (100 mg total) by mouth 2 (two) times daily. 60 capsule 3  . losartan (COZAAR) 100 MG tablet Take 1 tablet (100 mg total) by mouth daily. 90 tablet 1  . vitamin B-12  (CYANOCOBALAMIN) 1000 MCG tablet Take 1 tablet (1,000 mcg total) by mouth daily.    . metFORMIN (GLUCOPHAGE) 500 MG tablet Take 1 tablet (500 mg total) by mouth daily for 1 dose. With dinner 90 tablet 1   No current facility-administered medications on file prior to visit.     Past Medical History:  Diagnosis Date  . Diabetes mellitus without complication ([place])   . Hypertension     Past Surgical History:  Procedure Laterality Date  . ABDOMINAL HYSTERECTOMY     fibroids  . CARPAL TUNNEL RELEASE Bilateral   . CHOLECYSTECTOMY      Social History   Socioeconomic History  . Marital status: Widowed    Spouse name: Not on file  . Number of children: Not on file  . Years of education: Not on file  . Highest education level: Not on file  Occupational History  . Not on file  Social Needs  . Financial resource strain: Not on file  . Food insecurity    Worry: Not on file    Inability: Not on file  . Transportation needs    Medical: Not on file    Non-medical: Not on file  Tobacco Use  . Smoking status: Never Smoker  . Smokeless tobacco: Never Used  Substance and Sexual Activity  . Alcohol use: Yes    Comment: occasional  . Drug use: No  . Sexual  activity: Not on file  Lifestyle  . Physical activity    Days per week: Not on file    Minutes per session: Not on file  . Stress: Not on file  Relationships  . Social Musicianconnections    Talks on phone: Not on file    Gets together: Not on file    Attends religious service: Not on file    Active member of club or organization: Not on file    Attends meetings of clubs or organizations: Not on file    Relationship status: Not on file  Other Topics Concern  . Not on file  Social History Narrative   She walks her dog routinely.  Exercise: water aerobics 3 /week    Family History  Problem Relation Age of Onset  . Diabetes Mother   . Pancreatic cancer Sister     Review of Systems  Constitutional: Negative for chills and  fever.  Respiratory: Negative for cough, shortness of breath and wheezing.   Cardiovascular: Negative for chest pain, palpitations and leg swelling.  Musculoskeletal:       Occ cramping in toes  Neurological: Positive for dizziness (when BP is elevated). Negative for headaches.       Objective:   Vitals:   11/15/18 0946  BP: (!) 146/80  Pulse: 98  Temp: 98.2 F (36.8 C)  SpO2: 97%   BP Readings from Last 3 Encounters:  11/15/18 (!) 146/80  08/15/18 (!) 192/78  02/26/18 (!) 176/86   Wt Readings from Last 3 Encounters:  11/15/18 154 lb (69.9 kg)  08/15/18 161 lb 6.4 oz (73.2 kg)  02/26/18 162 lb (73.5 kg)   Body mass index is 31.1 kg/m.   Physical Exam    Constitutional: Appears well-developed and well-nourished. No distress.  HENT:  Head: Normocephalic and atraumatic.  Neck: Neck supple. No tracheal deviation present. No thyromegaly present.  No cervical lymphadenopathy Cardiovascular: Normal rate, regular rhythm and normal heart sounds.   No murmur heard. No carotid bruit .  No edema Pulmonary/Chest: Effort normal and breath sounds normal. No respiratory distress. No has no wheezes. No rales.  Skin: Skin is warm and dry. Not diaphoretic.  Psychiatric: Normal mood and affect. Behavior is normal.      Assessment & Plan:    See Problem List for Assessment and Plan of chronic medical problems.

## 2018-11-15 ENCOUNTER — Other Ambulatory Visit: Payer: Self-pay

## 2018-11-15 ENCOUNTER — Other Ambulatory Visit (INDEPENDENT_AMBULATORY_CARE_PROVIDER_SITE_OTHER): Payer: Medicare PPO

## 2018-11-15 ENCOUNTER — Encounter: Payer: Self-pay | Admitting: Internal Medicine

## 2018-11-15 ENCOUNTER — Ambulatory Visit (INDEPENDENT_AMBULATORY_CARE_PROVIDER_SITE_OTHER): Payer: Medicare PPO | Admitting: Internal Medicine

## 2018-11-15 VITALS — BP 146/80 | HR 98 | Temp 98.2°F | Ht 59.0 in | Wt 154.0 lb

## 2018-11-15 DIAGNOSIS — E782 Mixed hyperlipidemia: Secondary | ICD-10-CM | POA: Diagnosis not present

## 2018-11-15 DIAGNOSIS — G6289 Other specified polyneuropathies: Secondary | ICD-10-CM

## 2018-11-15 DIAGNOSIS — I10 Essential (primary) hypertension: Secondary | ICD-10-CM

## 2018-11-15 DIAGNOSIS — E119 Type 2 diabetes mellitus without complications: Secondary | ICD-10-CM

## 2018-11-15 LAB — COMPREHENSIVE METABOLIC PANEL
ALT: 30 U/L (ref 0–35)
AST: 26 U/L (ref 0–37)
Albumin: 4 g/dL (ref 3.5–5.2)
Alkaline Phosphatase: 78 U/L (ref 39–117)
BUN: 37 mg/dL — ABNORMAL HIGH (ref 6–23)
CO2: 26 mEq/L (ref 19–32)
Calcium: 10.4 mg/dL (ref 8.4–10.5)
Chloride: 105 mEq/L (ref 96–112)
Creatinine, Ser: 1.97 mg/dL — ABNORMAL HIGH (ref 0.40–1.20)
GFR: 29.64 mL/min — ABNORMAL LOW (ref >60.00)
Glucose, Bld: 92 mg/dL (ref 70–99)
Potassium: 4.6 mEq/L (ref 3.5–5.1)
Sodium: 142 mEq/L (ref 135–145)
Total Bilirubin: 0.5 mg/dL (ref 0.2–1.2)
Total Protein: 7.5 g/dL (ref 6.0–8.3)

## 2018-11-15 LAB — LIPID PANEL
Cholesterol: 201 mg/dL — ABNORMAL HIGH (ref 0–200)
HDL: 46.4 mg/dL (ref >39.00)
LDL Cholesterol: 133 mg/dL — ABNORMAL HIGH (ref 0–99)
NonHDL: 154.97
Total CHOL/HDL Ratio: 4
Triglycerides: 111 mg/dL (ref 0.0–149.0)
VLDL: 22.2 mg/dL (ref 0.0–40.0)

## 2018-11-15 LAB — HEMOGLOBIN A1C: Hgb A1c MFr Bld: 5.6 % (ref 4.6–6.5)

## 2018-11-15 MED ORDER — NEBIVOLOL HCL 2.5 MG PO TABS: 2.5000 mg | ORAL_TABLET | Freq: Every day | ORAL | 5 refills | Status: DC

## 2018-11-15 MED ORDER — ROSUVASTATIN CALCIUM 5 MG PO TABS: ORAL_TABLET | ORAL | 1 refills | Status: DC

## 2018-11-15 NOTE — Assessment & Plan Note (Signed)
Not controlled Continue amlodipine and losartan at current doses Start bystolic 2.5 mg daily if covered - can increase to 5 mg if needed monitor BP at home Decrease salt intake encouraged regular exercise cmp

## 2018-11-15 NOTE — Assessment & Plan Note (Addendum)
Taking gabapentin nightly and during the day prn Taking B12  Gabapentin is helping - continue

## 2018-11-15 NOTE — Assessment & Plan Note (Signed)
Agreed to try crestor 5 mg twice a week -  Check lipids, cmp

## 2018-11-15 NOTE — Assessment & Plan Note (Addendum)
Lab Results  Component Value Date   HGBA1C 5.9 08/15/2018    Continue metfmorin Check a1c Low sugar / carb diet Stressed regular exercise

## 2018-11-16 ENCOUNTER — Ambulatory Visit
Admission: RE | Admit: 2018-11-16 | Discharge: 2018-11-16 | Disposition: A | Discharge status: Home / Self Care | Payer: Medicare PPO | Source: Ambulatory Visit | Attending: Internal Medicine | Admitting: Internal Medicine

## 2018-11-16 DIAGNOSIS — Z1231 Encounter for screening mammogram for malignant neoplasm of breast: Secondary | ICD-10-CM | POA: Diagnosis not present

## 2018-11-17 ENCOUNTER — Telehealth: Payer: Self-pay | Admitting: Internal Medicine

## 2018-11-17 MED ORDER — LOSARTAN POTASSIUM 50 MG PO TABS: 50.0000 mg | ORAL_TABLET | Freq: Every day | ORAL | 3 refills | Status: DC

## 2018-11-17 MED ORDER — CARVEDILOL 6.25 MG PO TABS: 6.2500 mg | ORAL_TABLET | Freq: Two times a day (BID) | ORAL | 3 refills | Status: DC

## 2018-11-17 NOTE — Telephone Encounter (Signed)
This is a follow-up her result note from her recent blood work.  Her kidney function is decreased and I want to decrease losartan to 50 mg daily.  We will start carvedilol 6.25 mg twice daily.  Prescriptions sent to [facility]

## 2018-11-19 NOTE — Telephone Encounter (Signed)
LVM for pt to call back in regards.  

## 2018-11-19 NOTE — Telephone Encounter (Signed)
Pt aware of response.  

## 2019-02-07 ENCOUNTER — Telehealth: Payer: Self-pay

## 2019-02-07 DIAGNOSIS — G6289 Other specified polyneuropathies: Secondary | ICD-10-CM

## 2019-02-07 NOTE — Telephone Encounter (Signed)
Copied from [place] [phone]. Topic: Referral - Request for Referral >> Feb 07, 2019 11:11 AM Pauline Good wrote: Has patient seen PCP for this complaint? yes *If NO, is insurance requiring patient see PCP for this issue before PCP can refer them? Referral for which specialty: neurologist Preferred provider/office: don't matter Reason for referral: tingling in feet and legs  Please call after 3pm

## 2019-02-07 NOTE — Telephone Encounter (Signed)
referred

## 2019-02-08 NOTE — Telephone Encounter (Signed)
Pt aware.

## 2019-02-18 ENCOUNTER — Encounter: Payer: Self-pay | Admitting: Neurology

## 2019-03-18 ENCOUNTER — Other Ambulatory Visit: Payer: Self-pay

## 2019-03-18 ENCOUNTER — Ambulatory Visit: Payer: Medicare PPO | Admitting: Neurology

## 2019-03-18 ENCOUNTER — Encounter: Payer: Self-pay | Admitting: Neurology

## 2019-03-18 VITALS — BP 138/80 | HR 88 | Temp 98.6°F | Ht <= 58 in | Wt 157.0 lb

## 2019-03-18 DIAGNOSIS — R202 Paresthesia of skin: Secondary | ICD-10-CM | POA: Diagnosis not present

## 2019-03-18 DIAGNOSIS — M79601 Pain in right arm: Secondary | ICD-10-CM | POA: Diagnosis not present

## 2019-03-18 NOTE — Progress Notes (Signed)
Subjective:    Patient ID: Wendy Rogers, female    DOB: Feb 01, 1941, 78 y.o.   MRN: [id number]  HPI The patient is here for follow up.    We started bystolic and crestor 4 months ago.  She never started either 1.  She states Bystolic was too expensive and she does not want to take cholesterol medication.  She is here for follow up of her chronic medical conditions.   Diabetes: She is taking her medication daily as prescribed. She is compliant with a diabetic diet.     Hypertension: She is taking her medication daily, but never started the new medication because it was too expensive. She is compliant with a low sodium diet.  She has occasional lower extremity edema.  She denies chest pain, palpitations, shortness of breath and regular headaches. She does monitor her blood pressure at home - 140-145/?.  She does not think that her blood pressure has been more than 145 on top.  Neuropathy:  She is taking the gabapentin nightly and in the morning.  She continues to have the burning and stinging sensation, but feels that the gabapentin has helped and does not feel that she needs a higher dose.  She did request a podiatry referral for her diabetes and for this sensation.  She saw neurology and an EMG was discussed, which she is considering.  Hyperlipidemia: She never started the statin and does not want to take any medication for her cholesterol . She is compliant with a low fat/cholesterol diet.      Medications and allergies reviewed with patient and updated if appropriate.  Patient Active Problem List   Diagnosis Date Noted  . Hyperlipidemia 08/15/2018  . Acute right-sided thoracic back pain 02/15/2018  . Chest heaviness 02/15/2018  . Ear discomfort, left 06/22/2017  . Ringing in ear, left 06/22/2017  . Low TSH level 06/22/2017  . Skin abnormalities 05/15/2017  . Peripheral neuropathy 07/04/2016  . Foot pain, bilateral 07/04/2016  . Low vitamin B12 level 07/04/2016  .  Essential hypertension, benign 12/29/2015  . Diabetes ([place]) 12/29/2015  . Dry eyes 12/29/2015    Current Outpatient Medications on File Prior to Visit  Medication Sig Dispense Refill  . amLODipine (NORVASC) 5 MG tablet Take 1.5 tablets (7.5 mg total) by mouth daily. 135 tablet 1  . gabapentin (NEURONTIN) 100 MG capsule Take 1 capsule (100 mg total) by mouth 2 (two) times daily. 60 capsule 3  . losartan (COZAAR) 50 MG tablet Take 1 tablet (50 mg total) by mouth daily. 30 tablet 3  . vitamin B-12 (CYANOCOBALAMIN) 1000 MCG tablet Take 1 tablet (1,000 mcg total) by mouth daily.    . metFORMIN (GLUCOPHAGE) 500 MG tablet Take 1 tablet (500 mg total) by mouth daily for 1 dose. With dinner 90 tablet 1   No current facility-administered medications on file prior to visit.     Past Medical History:  Diagnosis Date  . Diabetes mellitus without complication ([place])   . Hypertension     Past Surgical History:  Procedure Laterality Date  . ABDOMINAL HYSTERECTOMY     fibroids  . CARPAL TUNNEL RELEASE Bilateral   . CHOLECYSTECTOMY      Social History   Socioeconomic History  . Marital status: Widowed    Spouse name: Not on file  . Number of children: Not on file  . Years of education: Not on file  . Highest education level: Not on file  Occupational History  . Not  on file  Social Needs  . Financial resource strain: Not on file  . Food insecurity    Worry: Not on file    Inability: Not on file  . Transportation needs    Medical: Not on file    Non-medical: Not on file  Tobacco Use  . Smoking status: Never Smoker  . Smokeless tobacco: Never Used  Substance and Sexual Activity  . Alcohol use: Yes    Comment: occasional, holidays  . Drug use: No  . Sexual activity: Not on file  Lifestyle  . Physical activity    Days per week: Not on file    Minutes per session: Not on file  . Stress: Not on file  Relationships  . Social Musician on phone: Not on file    Gets  together: Not on file    Attends religious service: Not on file    Active member of club or organization: Not on file    Attends meetings of clubs or organizations: Not on file    Relationship status: Not on file  Other Topics Concern  . Not on file  Social History Narrative   She walks her dog routinely.    Previously worked as a Tree surgeon.   Right handed.   One level with son.   Caffeine - soda 1 bottle/day   Education - 2 years college    Family History  Problem Relation Age of Onset  . Diabetes Mother   . Pancreatic cancer Sister   . Hypertension Father     Review of Systems  Constitutional: Negative for fever.  Respiratory: Negative for cough, shortness of breath and wheezing.   Cardiovascular: Positive for leg swelling (sometimes). Negative for chest pain and palpitations.  Neurological: Negative for light-headedness and headaches.       Objective:   Vitals:   03/19/19 0947  BP: (!) 160/68  Pulse: 92  Resp: 16  Temp: 98.2 F (36.8 C)  SpO2: 99%   BP Readings from Last 3 Encounters:  03/19/19 (!) 160/68  03/18/19 138/80  11/15/18 (!) 146/80   Wt Readings from Last 3 Encounters:  03/19/19 146 lb (66.2 kg)  03/18/19 157 lb (71.2 kg)  11/15/18 154 lb (69.9 kg)   Body mass index is 30.51 kg/m.   Physical Exam    Constitutional: Appears well-developed and well-nourished. No distress.  HENT:  Head: Normocephalic and atraumatic.  Neck: Neck supple. No tracheal deviation present. No thyromegaly present.  No cervical lymphadenopathy Cardiovascular: Normal rate, regular rhythm and normal heart sounds.   No murmur heard. No carotid bruit .  No edema Pulmonary/Chest: Effort normal and breath sounds normal. No respiratory distress. No has no wheezes. No rales.  Skin: Skin is warm and dry. Not diaphoretic.  Psychiatric: Normal mood and affect. Behavior is normal.      Assessment & Plan:    See Problem List for Assessment and Plan of chronic medical  problems.

## 2019-03-18 NOTE — Patient Instructions (Signed)
Nerve testing of the right arm and leg.  Do not apply any lotion, oil, or moisturizer on the arms and legs on the day of testing.  ELECTROMYOGRAM AND NERVE CONDUCTION STUDIES (EMG/NCS) INSTRUCTIONS  How to Prepare The neurologist conducting the EMG will need to know if you have certain medical conditions. Tell the neurologist and other EMG lab personnel if you: . Have a pacemaker or any other electrical medical device . Take blood-thinning medications . Have hemophilia, a blood-clotting disorder that causes prolonged bleeding Bathing Take a shower or bath shortly before your exam in order to remove oils from your skin. Don't apply lotions or creams before the exam.  What to Expect You'll likely be asked to change into a hospital gown for the procedure and lie down on an examination table. The following explanations can help you understand what will happen during the exam.  . Electrodes. The neurologist or a technician places surface electrodes at various locations on your skin depending on where you're experiencing symptoms. Or the neurologist may insert needle electrodes at different sites depending on your symptoms.  . Sensations. The electrodes will at times transmit a tiny electrical current that you may feel as a twinge or spasm. The needle electrode may cause discomfort or pain that usually ends shortly after the needle is removed. If you are concerned about discomfort or pain, you may want to talk to the neurologist about taking a short break during the exam.  . Instructions. During the needle EMG, the neurologist will assess whether there is any spontaneous electrical activity when the muscle is at rest - activity that isn't present in healthy muscle tissue - and the degree of activity when you slightly contract the muscle.  He or she will give you instructions on resting and contracting a muscle at appropriate times. Depending on what muscles and nerves the neurologist is examining, he or  she may ask you to change positions during the exam.  After your EMG You may experience some temporary, minor bruising where the needle electrode was inserted into your muscle. This bruising should fade within several days. If it persists, contact your primary care doctor.

## 2019-03-18 NOTE — Progress Notes (Signed)
[facility] Clinic Note - Initial Visit   Date: 03/18/19  Wendy Rogers MRN: [id number] DOB: 05-26-1941   Dear Dr. Quay Burow:  Thank you for your kind referral of Wendy Rogers for consultation of generalized tingling in the arms and legs. Although her history is well known to you, please allow [place] to reiterate it for the purpose of our medical record. The patient was accompanied to the clinic by self.    History of Present Illness: Wendy Rogers is a 78 y.o. right-handed female with well-controlled diabetes mellitus, hyperlipidemia, and hypertension presenting for evaluation of generalized tingling of the arms and legs.   Starting in early September, she began having burning sensation involving the feet, lower legs, and thighs.  It is worse when she gets up in the morning or when laying.  It occurs about 3-4 times per week, lasting several hours.  There is no associated neck or low back pain.  No specific triggers.  She also complains of soreness in the right arm, which does not have any specific pattern or triggers.   She takes gabapentin 100mg  BID, which provides relief for her leg symptoms.  She denies imbalance or weakness.  She walks unassisted and has not suffered any falls.   Out-side paper records, electronic medical record, and images have been reviewed where available and summarized as:  Lab Results  Component Value Date   HGBA1C 5.6 11/15/2018   Lab Results  Component Value Date   VITAMINB12 562 08/15/2018   Lab Results  Component Value Date   TSH 0.71 08/15/2018    Past Medical History:  Diagnosis Date  . Diabetes mellitus without complication ([place])   . Hypertension     Past Surgical History:  Procedure Laterality Date  . ABDOMINAL HYSTERECTOMY     fibroids  . CARPAL TUNNEL RELEASE Bilateral   . CHOLECYSTECTOMY       Medications:  Outpatient Encounter Medications as of 03/18/2019  Medication Sig  . amLODipine  (NORVASC) 5 MG tablet Take 1.5 tablets (7.5 mg total) by mouth daily.  . carvedilol (COREG) 6.25 MG tablet Take 1 tablet (6.25 mg total) by mouth 2 (two) times daily with a meal.  . gabapentin (NEURONTIN) 100 MG capsule Take 1 capsule (100 mg total) by mouth 2 (two) times daily.  Marland Kitchen losartan (COZAAR) 50 MG tablet Take 1 tablet (50 mg total) by mouth daily.  . metFORMIN (GLUCOPHAGE) 500 MG tablet Take 1 tablet (500 mg total) by mouth daily for 1 dose. With dinner  . rosuvastatin (CRESTOR) 5 MG tablet Take one tab twice weekly  . vitamin B-12 (CYANOCOBALAMIN) 1000 MCG tablet Take 1 tablet (1,000 mcg total) by mouth daily.   No facility-administered encounter medications on file as of 03/18/2019.     Allergies: No Known Allergies  Family History: Family History  Problem Relation Age of Onset  . Diabetes Mother   . Pancreatic cancer Sister     Social History: Social History   Tobacco Use  . Smoking status: Never Smoker  . Smokeless tobacco: Never Used  Substance Use Topics  . Alcohol use: Yes    Comment: occasional  . Drug use: No   Social History   Social History Narrative   She walks her dog routinely.  Exercise: water aerobics 3 /week    Review of Systems:  CONSTITUTIONAL: No fevers, chills, night sweats, or weight loss.   EYES: No visual changes or eye pain ENT: No hearing changes.  No  history of nose bleeds.   RESPIRATORY: No cough, wheezing and shortness of breath.   CARDIOVASCULAR: Negative for chest pain, and palpitations.   GI: Negative for abdominal discomfort, blood in stools or black stools.  No recent change in bowel habits.   GU:  No history of incontinence.   MUSCLOSKELETAL: No history of joint pain or swelling.  No myalgias.   SKIN: Negative for lesions, rash, and itching.   HEMATOLOGY/ONCOLOGY: Negative for prolonged bleeding, bruising easily, and swollen nodes.  No history of cancer.   ENDOCRINE: Negative for cold or heat intolerance, polydipsia or goiter.    PSYCH:  No depression or anxiety symptoms.   NEURO: As Above.   Vital Signs:  BP 138/80   Pulse 88   Temp 98.6 F (37 C)   Ht 4\' 10"  (1.473 m)   Wt 157 lb (71.2 kg)   SpO2 98%   BMI 32.81 kg/m    General Medical Exam:   General:  Well appearing, comfortable.   Eyes/ENT: see cranial nerve examination.   Neck:   No carotid bruits. Respiratory:  Clear to auscultation, good air entry bilaterally.   Cardiac:  Regular rate and rhythm, no murmur.   Extremities:  No deformities, edema, or skin discoloration.  Skin:  No rashes or lesions.  Neurological Exam: MENTAL STATUS including orientation to time, place, person, recent and remote memory, attention span and concentration, language, and fund of knowledge is normal.  Speech is not dysarthric.  CRANIAL NERVES: II:  No visual field defects.   III-IV-VI: Pupils equal round and reactive to light.  Normal conjugate, extra-ocular eye movements in all directions of gaze.  No nystagmus.  No ptosis .   V:  Normal facial sensation.    VII:  Normal facial symmetry and movements.   VIII:  Normal hearing and vestibular function.   IX-X:  Normal palatal movement.   XI:  Normal shoulder shrug and head rotation.   XII:  Normal tongue strength and range of motion, no deviation or fasciculation.  MOTOR:  No atrophy, fasciculations or abnormal movements.  No pronator drift.   Upper Extremity:  Right  Left  Deltoid  5/5   5/5   Biceps  5/5   5/5   Triceps  5/5   5/5   Infraspinatus 5/5  5/5  Medial pectoralis 5/5  5/5  Wrist extensors  5/5   5/5   Wrist flexors  5/5   5/5   Finger extensors  5/5   5/5   Finger flexors  5/5   5/5   Dorsal interossei  5/5   5/5   Abductor pollicis  5/5   5/5   Tone (Ashworth scale)  0  0   Lower Extremity:  Right  Left  Hip flexors  5/5   5/5   Hip extensors  5/5   5/5   Adductor 5/5  5/5  Abductor 5/5  5/5  Knee flexors  5/5   5/5   Knee extensors  5/5   5/5   Dorsiflexors  5/5   5/5    Plantarflexors  5/5   5/5   Toe extensors  5/5   5/5   Toe flexors  5/5   5/5   Tone (Ashworth scale)  0  0   MSRs:  Right        Left                  brachioradialis 2+  2+  biceps 2+  2+  triceps 2+  2+  patellar 2+  2+  ankle jerk 2+  2+  Hoffman no  no  plantar response down  down   SENSORY:  Normal and symmetric perception of light touch, pinprick, vibration, and proprioception.  Romberg's sign absent.   COORDINATION/GAIT: Normal finger-to- nose-finger.  Intact rapid alternating movements bilaterally.  Gait narrow based and stable. Tandem and stressed gait intact.    IMPRESSION: Generalized paresthesias of the legs, which does not conform to a pattern expected with diabetic neuropathy.  She has more widespread symptoms in the legs, involving up to the level of the thighs.  There is no sensory level or signs of myelopathy.  Less likely diabetic neuropathy given distal sensation, strength, and reflexes are intact and she has exceedingly well-controlled diabetes.  Right arm pain is described more as soreness, than neuropathic symptoms.  It's possible this is more musculoskeletal.  PLAN: Proceed with NCS/EMG of the right arm and leg to characterize the nature of her symptoms.  Further recommendations pending results.    Thank you for allowing me to participate in patient's care.  If I can answer any additional questions, I would be pleased to do so.    Sincerely,    Reno Clasby K. Allena KatzPatel, DO

## 2019-03-18 NOTE — Patient Instructions (Addendum)
  Tests ordered today. Your results will be released to [place] (or called to you) after review.  If any changes need to be made, you will be notified at that same time.   Medications reviewed and updated.  Changes include :   none   A referral was ordered for podiatry  Please followup in 6 months

## 2019-03-19 ENCOUNTER — Encounter: Payer: Self-pay | Admitting: Internal Medicine

## 2019-03-19 ENCOUNTER — Ambulatory Visit (INDEPENDENT_AMBULATORY_CARE_PROVIDER_SITE_OTHER): Payer: Medicare PPO | Admitting: Internal Medicine

## 2019-03-19 ENCOUNTER — Other Ambulatory Visit (INDEPENDENT_AMBULATORY_CARE_PROVIDER_SITE_OTHER): Payer: Medicare PPO

## 2019-03-19 VITALS — BP 160/68 | HR 92 | Temp 98.2°F | Resp 16 | Ht <= 58 in | Wt 146.0 lb

## 2019-03-19 DIAGNOSIS — I10 Essential (primary) hypertension: Secondary | ICD-10-CM

## 2019-03-19 DIAGNOSIS — E782 Mixed hyperlipidemia: Secondary | ICD-10-CM | POA: Diagnosis not present

## 2019-03-19 DIAGNOSIS — E119 Type 2 diabetes mellitus without complications: Secondary | ICD-10-CM | POA: Diagnosis not present

## 2019-03-19 DIAGNOSIS — G6289 Other specified polyneuropathies: Secondary | ICD-10-CM

## 2019-03-19 LAB — HEMOGLOBIN A1C: Hgb A1c MFr Bld: 5.7 % (ref 4.6–6.5)

## 2019-03-19 LAB — COMPREHENSIVE METABOLIC PANEL
ALT: 21 U/L (ref 0–35)
AST: 21 U/L (ref 0–37)
Albumin: 4.4 g/dL (ref 3.5–5.2)
Alkaline Phosphatase: 87 U/L (ref 39–117)
BUN: 28 mg/dL — ABNORMAL HIGH (ref 6–23)
CO2: 24 mEq/L (ref 19–32)
Calcium: 10 mg/dL (ref 8.4–10.5)
Chloride: 110 mEq/L (ref 96–112)
Creatinine, Ser: 0.92 mg/dL (ref 0.40–1.20)
GFR: 71.29 mL/min (ref >60.00)
Glucose, Bld: 93 mg/dL (ref 70–99)
Potassium: 4.1 mEq/L (ref 3.5–5.1)
Sodium: 142 mEq/L (ref 135–145)
Total Bilirubin: 0.3 mg/dL (ref 0.2–1.2)
Total Protein: 7.7 g/dL (ref 6.0–8.3)

## 2019-03-19 NOTE — Assessment & Plan Note (Signed)
CMP today since her kidney function was low last time it was checked Blood pressure has been elevated here fairly consistently, but better at home Discussed that I do want her systolic to be 163 or less consistently She will monitor her blood pressure at home-nurse visit to check blood pressure #1-2 weeks and she will bring her cuff at that time If BP is elevated we will try increasing amlodipine to 10 mg daily to see if she tolerates this

## 2019-03-19 NOTE — Assessment & Plan Note (Signed)
Continue gabapentin 100 mg twice daily-this has improved her symptoms She does not feel that she needs a higher dose at this time Did request referral to podiatry-referred

## 2019-03-19 NOTE — Assessment & Plan Note (Signed)
Has never taken the medication and does not want to take a statin Stressed regular exercise and low fat/cholesterol diet Will monitor lipid panel

## 2019-03-19 NOTE — Assessment & Plan Note (Signed)
Check A1c Taking Metformin 500 mg daily Encouraged regular exercise and low-sugar/carbohydrate diet

## 2019-04-03 ENCOUNTER — Ambulatory Visit: Payer: Medicare PPO

## 2019-04-03 ENCOUNTER — Other Ambulatory Visit: Payer: Self-pay

## 2019-04-03 ENCOUNTER — Telehealth: Payer: Self-pay

## 2019-04-03 NOTE — Telephone Encounter (Signed)
Ok thank you 

## 2019-04-03 NOTE — Telephone Encounter (Signed)
Patient recently had office visit with dr burns on 03/19/19---needed to bring her home bp device to compare with manual bp cuff---today's reading with manual cuff was 130/72---her home device is reading 163/80----patient advised that home device calibrations tend to wear out over appx 2 years if used regularly---patient will purchase new home bp machine and start using---routing to dr burns, bp reading is normal today, no need to increase amlodipine at this time---please advise if you are ok with this, thanks

## 2019-04-15 ENCOUNTER — Encounter: Payer: Self-pay | Admitting: Podiatry

## 2019-04-15 ENCOUNTER — Other Ambulatory Visit: Payer: Self-pay

## 2019-04-15 ENCOUNTER — Ambulatory Visit: Payer: Medicare PPO | Admitting: Podiatry

## 2019-04-15 VITALS — BP 158/89 | Temp 83.0°F

## 2019-04-15 DIAGNOSIS — M79675 Pain in left toe(s): Secondary | ICD-10-CM | POA: Diagnosis not present

## 2019-04-15 DIAGNOSIS — M79674 Pain in right toe(s): Secondary | ICD-10-CM

## 2019-04-15 DIAGNOSIS — B351 Tinea unguium: Secondary | ICD-10-CM

## 2019-04-15 DIAGNOSIS — E1142 Type 2 diabetes mellitus with diabetic polyneuropathy: Secondary | ICD-10-CM | POA: Diagnosis not present

## 2019-04-15 DIAGNOSIS — M2012 Hallux valgus (acquired), left foot: Secondary | ICD-10-CM | POA: Diagnosis not present

## 2019-04-15 DIAGNOSIS — M2011 Hallux valgus (acquired), right foot: Secondary | ICD-10-CM

## 2019-04-15 NOTE — Patient Instructions (Addendum)
Diabetic Neuropathy Diabetic neuropathy refers to nerve damage that is caused by diabetes (diabetes mellitus). Over time, people with diabetes can develop nerve damage throughout the body. There are several types of diabetic neuropathy:  Peripheral neuropathy. This is the most common type of diabetic neuropathy. It causes damage to nerves that carry signals between the spinal cord and other parts of the body (peripheral nerves). This usually affects nerves in the feet and legs first, and may eventually affect the hands and arms. The damage affects the ability to sense touch or temperature.  Autonomic neuropathy. This type causes damage to nerves that control involuntary functions (autonomic nerves). These nerves carry signals that control: ? Heartbeat. ? Body temperature. ? Blood pressure. ? Urination. ? Digestion. ? Sweating. ? Sexual function. ? Response to changing blood sugar (glucose) levels.  Focal neuropathy. This type of nerve damage affects one area of the body, such as an arm, a leg, or the face. The injury may involve one nerve or a small group of nerves. Focal neuropathy can be painful and unpredictable, and occurs most often in older adults with diabetes. This often develops suddenly, but usually improves over time and does not cause long-term problems.  Proximal neuropathy. This type of nerve damage affects the nerves of the thighs, hips, buttocks, or legs. It causes severe pain, weakness, and muscle death (atrophy), usually in the thigh muscles. It is more common among older men and people who have type 2 diabetes. The length of recovery time may vary. What are the causes? Peripheral, autonomic, and focal neuropathies are caused by diabetes that is not well controlled with treatment. The cause of proximal neuropathy is not known, but it may be caused by inflammation related to uncontrolled blood glucose levels. What are the signs or symptoms? Peripheral neuropathy Peripheral  neuropathy develops slowly over time. When the nerves of the feet and legs no longer work, you may experience:  Burning, stabbing, or aching pain in the legs or feet.  Pain or cramping in the legs or feet.  Loss of feeling (numbness) and inability to feel pressure or pain in the feet. This can lead to: ? Thick calluses or sores on areas of constant pressure. ? Ulcers. ? Reduced ability to feel temperature changes.  Foot deformities.  Muscle weakness.  Loss of balance or coordination. Autonomic neuropathy The symptoms of autonomic neuropathy vary depending on which nerves are affected. Symptoms may include:  Problems with digestion, such as: ? Nausea or vomiting. ? Poor appetite. ? Bloating. ? Diarrhea or constipation. ? Trouble swallowing. ? Losing weight without trying to.  Problems with the heart, blood and lungs, such as: ? Dizziness, especially when standing up. ? Fainting. ? Shortness of breath. ? Irregular heartbeat.  Bladder problems, such as: ? Trouble starting or stopping urination. ? Leaking urine. ? Trouble emptying the bladder. ? Urinary tract infections (UTIs).  Problems with other body functions, such as: ? Sweat. You may sweat too much or too little. ? Temperature. You might get hot easily. Or, you might feel cold more than usual. ? Sexual function. Men may not be able to get or maintain an erection. Women may have vaginal dryness and difficulty with arousal. Focal neuropathy Symptoms affect only one area of the body. Common symptoms include:  Numbness.  Tingling.  Burning pain.  Prickling feeling.  Very sensitive skin.  Weakness.  Inability to move (paralysis).  Muscle twitching.  Muscles getting smaller (wasting).  Poor coordination.  Double or blurred vision. Proximal  neuropathy °· Sudden, severe pain in the hip, thigh, or buttocks. Pain may spread from the back into the legs (sciatica). °· Pain and numbness in the arms and  legs. °· Tingling. °· Loss of bladder control or bowel control. °· Weakness and wasting of thigh muscles. °· Difficulty getting up from a seated position. °· Abdominal swelling. °· Unexplained weight loss. °How is this diagnosed? °Diagnosis usually involves reviewing your medical history and any symptoms you have. Diagnosis varies depending on the type of neuropathy your health care provider suspects. °Peripheral neuropathy °Your health care provider will check areas that are affected by your nervous system (neurologic exam), such as your reflexes, how you move, and what you can feel. You may have other tests, such as: °· Blood tests. °· Removal and examination of fluid that surrounds the spinal cord (lumbar puncture). °· CT scan. °· MRI. °· A test to check the nerves that control muscles (electromyogram, EMG). °· Tests of how quickly messages pass through your nerves (nerve conduction velocity tests). °· Removal of a small piece of nerve to be examined under a microscope (biopsy). °Autonomic neuropathy °You may have tests, such as: °· Tests to measure your blood pressure and heart rate. This may include monitoring you while you are safely secured to an exam table that moves you from a lying position to an upright position (table tilt test). °· Breathing tests to check your lungs. °· Tests to check how food moves through the digestive system (gastric emptying tests). °· Blood, sweat, or urine tests. °· Ultrasound of your bladder. °· Spinal fluid tests. °Focal neuropathy °This condition may be diagnosed with: °· A neurologic exam. °· CT scan. °· MRI. °· EMG. °· Nerve conduction velocity tests. °Proximal neuropathy °There is no test to diagnose this type of neuropathy. You may have tests to rule out other possible causes of this type of neuropathy. Tests may include: °· X-rays of your spine and lumbar region. °· Lumbar puncture. °· MRI. °How is this treated? °The goal of treatment is to keep nerve damage from getting  worse. The most important part of treatment is keeping your blood glucose level and your A1C level within your target range by following your diabetes management plan. Over time, maintaining lower blood glucose levels helps lessen symptoms. In some cases, you may need prescription pain medicine. °Follow these instructions at home: ° °Lifestyle ° °· Do not use any products that contain nicotine or tobacco, such as cigarettes and e-cigarettes. If you need help quitting, ask your health care provider. °· Be physically active every day. Include strength training and balance exercises. °· Follow a healthy meal plan. °· Work with your health care provider to manage your blood pressure. °General instructions °· Follow your diabetes management plan as directed. °? Check your blood glucose levels as directed by your health care provider. °? Keep your blood glucose in your target range as directed by your health care provider. °? Have your A1C level checked at least two times a year, or as often as told by your health care provider. °· Take over the counter and prescription medicines only as told by your health care provider. This includes insulin and diabetes medicine. °· Do not drive or use heavy machinery while taking prescription pain medicines. °· Check your skin and feet every day for cuts, bruises, redness, blisters, or sores. °· Keep all follow up visits as told by your health care provider. This is important. °Contact a health care provider if: °·   You have burning, stabbing, or aching pain in your legs or feet.  You are unable to feel pressure or pain in your feet.  You develop problems with digestion, such as: ? Nausea. ? Vomiting. ? Bloating. ? Constipation. ? Diarrhea. ? Abdominal pain.  You have difficulty with urination, such as inability: ? To control when you urinate (incontinence). ? To completely empty the bladder (retention).  You have palpitations.  You feel dizzy, weak, or faint when you  stand up. Get help right away if:  You cannot urinate.  You have sudden weakness or loss of coordination.  You have trouble speaking.  You have pain or pressure in your chest.  You have an irregular heart beat.  You have sudden inability to move a part of your body. Summary  Diabetic neuropathy refers to nerve damage that is caused by diabetes. It can affect nerves throughout the entire body, causing numbness and pain in the arms, legs, digestive tract, heart, and other body systems.  Keep your blood glucose level and your blood pressure in your target range, as directed by your health care provider. This can help prevent neuropathy from getting worse.  Check your skin and feet every day for cuts, bruises, redness, blisters, or sores.  Do not use any products that contain nicotine or tobacco, such as cigarettes and e-cigarettes. If you need help quitting, ask your health care provider. This information is not intended to replace advice given to you by your health care provider. Make sure you discuss any questions you have with your health care provider. Document Released: 07/25/2001 Document Revised: 06/28/2017 Document Reviewed: 06/20/2016 Elsevier Patient Education  [place]  A bunion is a bump on the base of the big toe that forms when the bones of the big toe joint move out of position. Bunions may be small at first, but they often get larger over time. They can make walking painful. What are the causes? A bunion may be caused by:  Wearing narrow or pointed shoes that force the big toe to press against the other toes.  Abnormal foot development that causes the foot to roll inward (pronate).  Changes in the foot that are caused by certain diseases, such as rheumatoid arthritis or polio.  A foot injury. What increases the risk? The following factors may make you more likely to develop this condition:  Wearing shoes that squeeze the toes  together.  Having certain diseases, such as: ? Rheumatoid arthritis. ? Polio. ? Cerebral palsy.  Having family members who have bunions.  Being born with a foot deformity, such as flat feet or low arches.  Doing activities that put a lot of pressure on the feet, such as ballet dancing. What are the signs or symptoms? The main symptom of a bunion is a noticeable bump on the big toe. Other symptoms may include:  Pain.  Swelling around the big toe.  Redness and inflammation.  Thick or hardened skin on the big toe or between the toes.  Stiffness or loss of motion in the big toe.  Trouble with walking. How is this diagnosed? A bunion may be diagnosed based on your symptoms, medical history, and activities. You may have tests, such as:  X-rays. These allow your health care provider to check the position of the bones in your foot and look for damage to your joint. They also help your health care provider determine the severity of your bunion and the best way to treat  it.  Joint aspiration. In this test, a sample of fluid is removed from the toe joint. This test may be done if you are in a lot of pain. It helps rule out diseases that cause painful swelling of the joints, such as arthritis. How is this treated? Treatment depends on the severity of your symptoms. The goal of treatment is to relieve symptoms and prevent the bunion from getting worse. Your health care provider may recommend:  Wearing shoes that have a wide toe box.  Using bunion pads to cushion the affected area.  Taping your toes together to keep them in a normal position.  Placing a device inside your shoe (orthotics) to help reduce pressure on your toe joint.  Taking medicine to ease pain, inflammation, and swelling.  Applying heat or ice to the affected area.  Doing stretching exercises.  Surgery to remove scar tissue and move the toes back into their normal position. This treatment is rare. Follow these  instructions at home: Managing pain, stiffness, and swelling   If directed, put ice on the painful area: ? Put ice in a plastic bag. ? Place a towel between your skin and the bag. ? Leave the ice on for 20 minutes, 2-3 times a day. Activity   If directed, apply heat to the affected area before you exercise. Use the heat source that your health care provider recommends, such as a moist heat pack or a heating pad. ? Place a towel between your skin and the heat source. ? Leave the heat on for 20-30 minutes. ? Remove the heat if your skin turns bright red. This is especially important if you are unable to feel pain, heat, or cold. You may have a greater risk of getting burned.  Do exercises as told by your health care provider. General instructions  Support your toe joint with proper footwear, shoe padding, or taping as told by your health care provider.  Take over-the-counter and prescription medicines only as told by your health care provider.  Keep all follow-up visits as told by your health care provider. This is important. Contact a health care provider if your symptoms:  Get worse.  Do not improve in 2 weeks. Get help right away if you have:  Severe pain and trouble with walking. Summary  A bunion is a bump on the base of the big toe that forms when the bones of the big toe joint move out of position.  Bunions can make walking painful.  Treatment depends on the severity of your symptoms.  Support your toe joint with proper footwear, shoe padding, or taping as told by your health care provider. This information is not intended to replace advice given to you by your health care provider. Make sure you discuss any questions you have with your health care provider. Document Released: 05/16/2005 Document Revised: 11/20/2017 Document Reviewed: 09/26/2017 Elsevier Patient Education  2020 [employer].

## 2019-04-16 ENCOUNTER — Encounter: Payer: Medicare PPO | Admitting: Neurology

## 2019-04-21 NOTE — Progress Notes (Signed)
Subjective: Wendy Rogers presents today referred by Pincus Sanes, MD for diabetic foot evaluation.  Patient relates 10 year history of diabetes.  Patient denies any history of foot wounds.  Patient denies any history of numbness in feet.   She relates burning in feet which radiates up her legs when she is in bed.  She relates tingling in feet/legs.   She does take gabapentin 100 mg twice daily.  She also wears compression hose on a daily basis.  Today, patient c/o of painful, discolored, thick toenails which interfere with daily activities.  Pain is aggravated when wearing enclosed shoe gear.   Past Medical History:  Diagnosis Date  . Diabetes mellitus without complication (HCC)   . Hypertension     Patient Active Problem List   Diagnosis Date Noted  . Hyperlipidemia 08/15/2018  . Acute right-sided thoracic back pain 02/15/2018  . Chest heaviness 02/15/2018  . Ear discomfort, left 06/22/2017  . Ringing in ear, left 06/22/2017  . Low TSH level 06/22/2017  . Skin abnormalities 05/15/2017  . Peripheral neuropathy 07/04/2016  . Foot pain, bilateral 07/04/2016  . Low vitamin B12 level 07/04/2016  . Essential hypertension, benign 12/29/2015  . Diabetes (HCC) 12/29/2015  . Dry eyes 12/29/2015    Past Surgical History:  Procedure Laterality Date  . ABDOMINAL HYSTERECTOMY     fibroids  . CARPAL TUNNEL RELEASE Bilateral   . CHOLECYSTECTOMY      Current Outpatient Medications on File Prior to Visit  Medication Sig Dispense Refill  . losartan (COZAAR) 100 MG tablet Take 100 mg by mouth daily.    Marland Kitchen amLODipine (NORVASC) 5 MG tablet Take 1.5 tablets (7.5 mg total) by mouth daily. 135 tablet 1  . gabapentin (NEURONTIN) 100 MG capsule Take 1 capsule (100 mg total) by mouth 2 (two) times daily. 60 capsule 3  . losartan (COZAAR) 50 MG tablet Take 1 tablet (50 mg total) by mouth daily. (Patient not taking: Reported on 04/15/2019) 30 tablet 3  . metFORMIN (GLUCOPHAGE) 500  MG tablet Take 1 tablet (500 mg total) by mouth daily for 1 dose. With dinner 90 tablet 1  . vitamin B-12 (CYANOCOBALAMIN) 1000 MCG tablet Take 1 tablet (1,000 mcg total) by mouth daily.     No current facility-administered medications on file prior to visit.      Allergies  Allergen Reactions  . Penicillins Hives    Social History   Occupational History  . Not on file  Tobacco Use  . Smoking status: Never Smoker  . Smokeless tobacco: Never Used  Substance and Sexual Activity  . Alcohol use: Yes    Comment: occasional, holidays  . Drug use: No  . Sexual activity: Not on file    Family History  Problem Relation Age of Onset  . Diabetes Mother   . Pancreatic cancer Sister   . Hypertension Father      There is no immunization history on file for this patient.  Review of systems: Positive Findings in bold print.  Constitutional:  chills, fatigue, fever, sweats, weight change Communication: Nurse, learning disability, sign Presenter, broadcasting, hand writing, iPad/Android device Head: headaches, head injury Eyes: changes in vision, eye pain, glaucoma, cataracts, macular degeneration, diplopia, glare,  light sensitivity, eyeglasses or contacts, blindness Ears nose mouth throat: hearing impaired, hearing aids,  ringing in ears, deaf, sign language,  vertigo, nosebleeds,  rhinitis,  cold sores, snoring, swollen glands Cardiovascular: HTN, edema, arrhythmia, pacemaker in place, defibrillator in place, chest pain/tightness, chronic anticoagulation, blood clot,  heart failure, MI Peripheral Vascular: leg cramps, varicose veins, blood clots, lymphedema, varicosities Respiratory:  difficulty breathing, denies congestion, SOB, wheezing, cough, emphysema Gastrointestinal: change in appetite or weight, abdominal pain, constipation, diarrhea, nausea, vomiting, vomiting blood, change in bowel habits, abdominal pain, jaundice, rectal bleeding, hemorrhoids, GERD Genitourinary:  nocturia,  pain on urination,  polyuria,  blood in urine, Foley catheter, urinary urgency, ESRD on hemodialysis Musculoskeletal: amputation, cramping, stiff joints, painful joints, decreased joint motion, fractures, OA, gout, hemiplegia, paraplegia, uses cane, wheelchair bound, uses walker, uses rollator Skin: +changes in toenails, color change, dryness, itching, mole changes,  rash, wound(s) Neurological: headaches, numbness in feet, paresthesias in feet, burning in feet, fainting,  seizures, change in speech,  headaches, memory problems/poor historian, cerebral palsy, weakness, paralysis, CVA, TIA Endocrine: diabetes, hypothyroidism, hyperthyroidism,  goiter, dry mouth, flushing, heat intolerance,  cold intolerance,  excessive thirst, denies polyuria,  nocturia Hematological:  easy bleeding, excessive bleeding, easy bruising, enlarged lymph nodes, on long term blood thinner, history of past transusions Allergy/immunological:  hives, eczema, frequent infections, multiple drug allergies, seasonal allergies, transplant recipient, multiple food allergies Psychiatric:  anxiety, depression, mood disorder, suicidal ideations, hallucinations, insomnia  Objective: Vitals:   04/15/19 1100  BP: (!) 158/89  Temp: (!) 83 F (28.3 C)   Vascular Examination: Capillary refill time immediate b/l.  Dorsalis pedis pulses 2/4 b/l.  Posterior tibial pulses 2/4 b/l.   Digital hair decreased b/l.    Skin temperature gradient WNL b/l.   Nonpitting edema b/l.  Dermatological Examination: Skin with normal turgor, texture and tone b/l.  Toenails 1-5 b/l discolored, thick, dystrophic with subungual debris and pain with palpation to nailbeds due to thickness of nails.  Musculoskeletal: Muscle strength 5/5 to all LE muscle groups b/l.   HAV with bunion deformity b/l.   Neurological: Sensation intact 5/5 b/l with 10 gram monofilament.  Vibratory sensation intact b/l.  Assessment: 1. Painful onychomycosis toenails 1-5 b/l  2. HAV  with bunion b/l 3. NIDDM with peripheral neuropathy  Plan: 1. Discussed diabetic foot care principles. Literature dispensed on today. 2. Discussed her neuropathy symptoms. I have asked her to discuss her symptoms with her PCP.  3. Toenails 1-5 b/l were debrided in length and girth without iatrogenic bleeding. 4. Patient to continue soft, supportive shoe gear daily.  5. Patient to report any pedal injuries to medical professional immediately. 6. Follow up 3 months.  7. Patient/POA to call should there be a concern in the interim.

## 2019-04-22 ENCOUNTER — Ambulatory Visit
Admission: EM | Admit: 2019-04-22 | Discharge: 2019-04-22 | Disposition: A | Discharge status: Home / Self Care | Payer: Medicare PPO

## 2019-04-22 DIAGNOSIS — K219 Gastro-esophageal reflux disease without esophagitis: Secondary | ICD-10-CM

## 2019-04-22 DIAGNOSIS — R0789 Other chest pain: Secondary | ICD-10-CM

## 2019-04-22 DIAGNOSIS — I10 Essential (primary) hypertension: Secondary | ICD-10-CM

## 2019-04-22 NOTE — ED Triage Notes (Signed)
Pt c/o waking up with center to left chest pressure, nervousness and dizzy. States took her b/p 173/80. States took her b/p med and laid down. States still feeling the same. The chest pressure has ease a little.

## 2019-04-22 NOTE — ED Provider Notes (Signed)
[facility]    CSN: [id number] Arrival date & time: 04/22/19  1206      History   Chief Complaint Chief Complaint  Patient presents with  . Chest Pain    HPI Wendy Rogers is a 78 y.o. female.   78 year old with history of HTN, DM, HLD comes in for left chest pressure that lasted for about 5 mins after waking up this morning. She is currently asymptomatic. States had chest pressure to the mid to left chest that resolved on own after 5 mins. At the time, she did not experience any shortness of breath, nausea, vomiting, diaphoresis. Denies weakness, dizziness, syncope. She had some "jittery" feeling when she stood up that resolved as well. Denies palpitations, leg swelling, orthopnea. Denies URI symptoms such as cough, congestion, sore throat. Denies fever, chills, body aches.   She denies personal history of heart disease. Walks dog 4 times around the block every day with uphill climb. Denies exertional fatigue, exertional chest pain, dyspnea on exertion. Her DM is well controlled with last a1c <6. HTN is usually well controlled as well. HLD improved. Denies family history of heart disease.   Patient fell asleep 20 mins after dinner last night. Had big meal with fatty foods. Denies burning sensation to the chest, trouble swallowing.      Past Medical History:  Diagnosis Date  . Diabetes mellitus without complication ([place])   . Hypertension     Patient Active Problem List   Diagnosis Date Noted  . Hyperlipidemia 08/15/2018  . Acute right-sided thoracic back pain 02/15/2018  . Chest heaviness 02/15/2018  . Ear discomfort, left 06/22/2017  . Ringing in ear, left 06/22/2017  . Low TSH level 06/22/2017  . Skin abnormalities 05/15/2017  . Peripheral neuropathy 07/04/2016  . Foot pain, bilateral 07/04/2016  . Low vitamin B12 level 07/04/2016  . Essential hypertension, benign 12/29/2015  . Diabetes ([place]) 12/29/2015  . Dry eyes 12/29/2015    Past Surgical  History:  Procedure Laterality Date  . ABDOMINAL HYSTERECTOMY     fibroids  . CARPAL TUNNEL RELEASE Bilateral   . CHOLECYSTECTOMY      OB History   No obstetric history on file.      Home Medications    Prior to Admission medications   Medication Sig Start Date End Date Taking? Authorizing Provider  amLODipine (NORVASC) 5 MG tablet Take 1.5 tablets (7.5 mg total) by mouth daily. 08/15/18   Binnie Rail, MD  gabapentin (NEURONTIN) 100 MG capsule Take 1 capsule (100 mg total) by mouth 2 (two) times daily. 08/15/18   Binnie Rail, MD  losartan (COZAAR) 100 MG tablet Take 100 mg by mouth daily.    [provider]  losartan (COZAAR) 50 MG tablet Take 1 tablet (50 mg total) by mouth daily. Patient not taking: Reported on 04/15/2019 11/17/18   Binnie Rail, MD  metFORMIN (GLUCOPHAGE) 500 MG tablet Take 1 tablet (500 mg total) by mouth daily for 1 dose. With dinner 08/15/18 08/16/18  Binnie Rail, MD  vitamin B-12 (CYANOCOBALAMIN) 1000 MCG tablet Take 1 tablet (1,000 mcg total) by mouth daily. 08/04/16   Binnie Rail, MD    Family History Family History  Problem Relation Age of Onset  . Diabetes Mother   . Pancreatic cancer Sister   . Hypertension Father     Social History Social History   Tobacco Use  . Smoking status: Never Smoker  . Smokeless tobacco: Never Used  Substance Use  Topics  . Alcohol use: Yes    Comment: occasional, holidays  . Drug use: No     Allergies   Penicillins   Review of Systems Review of Systems  Reason unable to perform ROS: See HPI as above.     Physical Exam Triage Vital Signs ED Triage Vitals [04/22/19 1225]  Enc Vitals Group     BP (!) 158/83     Pulse Rate 94     Resp 16     Temp 98.3 F (36.8 C)     Temp Source Oral     SpO2 96 %     Weight      Height      Head Circumference      Peak Flow      Pain Score 0     Pain Loc      Pain Edu?      Excl. in GC?    No data found.  Updated Vital Signs BP (!)  158/83 (BP Location: Left Arm)   Pulse 94   Temp 98.3 F (36.8 C) (Oral)   Resp 16   SpO2 96%   Physical Exam Constitutional:      General: She is not in acute distress.    Appearance: She is well-developed. She is not diaphoretic.  HENT:     Head: Normocephalic and atraumatic.  Eyes:     Conjunctiva/sclera: Conjunctivae normal.     Pupils: Pupils are equal, round, and reactive to light.  Cardiovascular:     Rate and Rhythm: Normal rate and regular rhythm.     Heart sounds: No murmur. No friction rub. No gallop.   Pulmonary:     Effort: Pulmonary effort is normal. No respiratory distress.     Comments: LCTAB Abdominal:     General: A surgical scar is present. Bowel sounds are normal.     Palpations: Abdomen is soft.     Comments: Mild epigastric tenderness to palpation without guarding or rebound.  Skin:    General: Skin is warm and dry.  Neurological:     Mental Status: She is alert and oriented to person, place, and time.      UC Treatments / Results  Labs (all labs ordered are listed, but only abnormal results are displayed) Labs Reviewed - No data to display  EKG   Radiology No results found.  Procedures Procedures (including critical care time)  Medications Ordered in UC Medications - No data to display  Initial Impression / Assessment and Plan / UC Course  I have reviewed the triage vital signs and the nursing notes.  Pertinent labs & imaging results that were available during my care of the patient were reviewed by me and considered in my medical decision making (see chart for details).    78 year old female with history of DM, HTN, HLD comes in for left-sided chest pressure that lasted for 5 minutes.  She is currently asymptomatic.  EKG normal sinus rhythm, 88 bpm, no ST changes, no changes to prior EKG. Without troponin testing, her heart score is 4 at this time. Patient without history of CAD, and has continued to exercise without exertional chest  pain/fatigue, DOB. At this time, symptoms seem to be more related to GERD like symptoms. Given symptoms has completely resolved, patient can monitor and get otc H2 blocker if needed. Discussed avoiding big meals at night and to avoid food within 2 hours of bedtimes. Strict return precautions given. Patient expresses understanding and agrees to plan.  Final Clinical Impressions(s) / UC Diagnoses   Final diagnoses:  Atypical chest pain  Gastroesophageal reflux disease without esophagitis   ED Prescriptions    None     PDMP not reviewed this encounter.   Belinda FisherYu, Miguelina Fore V, PA-C 04/22/19 1429

## 2019-04-22 NOTE — Discharge Instructions (Signed)
No alarming signs at this time. You can start over the counter tums/pepcid if continues with symptoms. Eat small meals and avoid laying down/sleeping at least 1-2 hours after the meal. If noticing worsening symptoms, shortness of breath, nausea/vomiting, dizziness, go to the emergency department for further evaluation needed.

## 2019-05-01 ENCOUNTER — Ambulatory Visit (INDEPENDENT_AMBULATORY_CARE_PROVIDER_SITE_OTHER): Payer: Medicare PPO | Admitting: Internal Medicine

## 2019-05-01 ENCOUNTER — Other Ambulatory Visit (INDEPENDENT_AMBULATORY_CARE_PROVIDER_SITE_OTHER): Payer: Medicare PPO

## 2019-05-01 ENCOUNTER — Encounter: Payer: Self-pay | Admitting: Internal Medicine

## 2019-05-01 ENCOUNTER — Other Ambulatory Visit: Payer: Self-pay

## 2019-05-01 VITALS — BP 150/80 | HR 89 | Temp 98.7°F | Resp 16 | Ht <= 58 in

## 2019-05-01 DIAGNOSIS — Z8739 Personal history of other diseases of the musculoskeletal system and connective tissue: Secondary | ICD-10-CM | POA: Insufficient documentation

## 2019-05-01 DIAGNOSIS — M2550 Pain in unspecified joint: Secondary | ICD-10-CM

## 2019-05-01 DIAGNOSIS — M109 Gout, unspecified: Secondary | ICD-10-CM

## 2019-05-01 LAB — URIC ACID: Uric Acid, Serum: 6.7 mg/dL (ref 2.4–7.0)

## 2019-05-01 LAB — COMPREHENSIVE METABOLIC PANEL
ALT: 63 U/L — ABNORMAL HIGH (ref 0–35)
AST: 51 U/L — ABNORMAL HIGH (ref 0–37)
Albumin: 4 g/dL (ref 3.5–5.2)
Alkaline Phosphatase: 82 U/L (ref 39–117)
BUN: 25 mg/dL — ABNORMAL HIGH (ref 6–23)
CO2: 27 mEq/L (ref 19–32)
Calcium: 10.3 mg/dL (ref 8.4–10.5)
Chloride: 105 mEq/L (ref 96–112)
Creatinine, Ser: 1.28 mg/dL — ABNORMAL HIGH (ref 0.40–1.20)
GFR: 48.69 mL/min — ABNORMAL LOW (ref >60.00)
Glucose, Bld: 105 mg/dL — ABNORMAL HIGH (ref 70–99)
Potassium: 3.8 mEq/L (ref 3.5–5.1)
Sodium: 142 mEq/L (ref 135–145)
Total Bilirubin: 0.8 mg/dL (ref 0.2–1.2)
Total Protein: 7.6 g/dL (ref 6.0–8.3)

## 2019-05-01 LAB — CBC WITH DIFFERENTIAL/PLATELET
Basophils Absolute: 0.1 10*3/uL (ref 0.0–0.1)
Basophils Relative: 1.1 % (ref 0.0–3.0)
Eosinophils Absolute: 0 10*3/uL (ref 0.0–0.7)
Eosinophils Relative: 0.4 % (ref 0.0–5.0)
HCT: 31.2 % — ABNORMAL LOW (ref 36.0–46.0)
Hemoglobin: 10 g/dL — ABNORMAL LOW (ref 12.0–15.0)
Lymphocytes Relative: 21.4 % (ref 12.0–46.0)
Lymphs Abs: 1.7 10*3/uL (ref 0.7–4.0)
MCHC: 32.2 g/dL (ref 30.0–36.0)
MCV: 87.8 fl (ref 78.0–100.0)
Monocytes Absolute: 1 10*3/uL (ref 0.1–1.0)
Monocytes Relative: 12.6 % — ABNORMAL HIGH (ref 3.0–12.0)
Neutro Abs: 5.2 10*3/uL (ref 1.4–7.7)
Neutrophils Relative %: 64.5 % (ref 43.0–77.0)
Platelets: 275 10*3/uL (ref 150.0–400.0)
RBC: 3.55 Mil/uL — ABNORMAL LOW (ref 3.87–5.11)
RDW: 13.4 % (ref 11.5–15.5)
WBC: 8 10*3/uL (ref 4.0–10.5)

## 2019-05-01 LAB — SEDIMENTATION RATE: Sed Rate: 130 mm/hr — ABNORMAL HIGH (ref 0–30)

## 2019-05-01 MED ORDER — PREDNISONE 10 MG PO TABS: ORAL_TABLET | ORAL | 0 refills | Status: DC

## 2019-05-01 MED ORDER — TRAMADOL HCL 50 MG PO TABS: 50.0000 mg | ORAL_TABLET | Freq: Three times a day (TID) | ORAL | 0 refills | Status: AC | PRN

## 2019-05-01 NOTE — Assessment & Plan Note (Signed)
Right first mtp and left lateral malleolus  Likely gout, but could be infammatory arthritis No h/o gout Cbc, cmp, esr, uric acid Prednisone taper Tramadol for severe pain Tylenol for mild- mod pain

## 2019-05-01 NOTE — Assessment & Plan Note (Signed)
Symptoms likely related to gout - R first  MTP and left malleolus involved  Esr, uric acid level, cbc, cmp Prednisone taper

## 2019-05-01 NOTE — Progress Notes (Signed)
Subjective:    Patient ID: Wendy Rogers, female    DOB: 1940/09/27, 78 y.o.   MRN: [id number]  HPI The patient is here for an acute visit.   B/l feet pain:  She started having pain in her right foot one week ago.  She woke up with foot pain and swelling.  She denies injuries.  In the past day or two she she started having left foot and ankle pain and swelling.      She denies fever or chills.  She denies N/ T.  She denies SOB.  There is no other areas involved.    She denies any change in meds, salt, food.  She has not had much of an appetite in the past week.     Medications and allergies reviewed with patient and updated if appropriate.  Patient Active Problem List   Diagnosis Date Noted  . Hyperlipidemia 08/15/2018  . Acute right-sided thoracic back pain 02/15/2018  . Chest heaviness 02/15/2018  . Ear discomfort, left 06/22/2017  . Ringing in ear, left 06/22/2017  . Low TSH level 06/22/2017  . Skin abnormalities 05/15/2017  . Peripheral neuropathy 07/04/2016  . Foot pain, bilateral 07/04/2016  . Low vitamin B12 level 07/04/2016  . Essential hypertension, benign 12/29/2015  . Diabetes ([place]) 12/29/2015  . Dry eyes 12/29/2015    Current Outpatient Medications on File Prior to Visit  Medication Sig Dispense Refill  . amLODipine (NORVASC) 5 MG tablet Take 1.5 tablets (7.5 mg total) by mouth daily. 135 tablet 1  . gabapentin (NEURONTIN) 100 MG capsule Take 1 capsule (100 mg total) by mouth 2 (two) times daily. 60 capsule 3  . losartan (COZAAR) 100 MG tablet Take 100 mg by mouth daily.    Marland Kitchen losartan (COZAAR) 50 MG tablet Take 1 tablet (50 mg total) by mouth daily. 30 tablet 3  . vitamin B-12 (CYANOCOBALAMIN) 1000 MCG tablet Take 1 tablet (1,000 mcg total) by mouth daily.    . metFORMIN (GLUCOPHAGE) 500 MG tablet Take 1 tablet (500 mg total) by mouth daily for 1 dose. With dinner 90 tablet 1   No current facility-administered medications on file prior to visit.      Past Medical History:  Diagnosis Date  . Diabetes mellitus without complication ([place])   . Hypertension     Past Surgical History:  Procedure Laterality Date  . ABDOMINAL HYSTERECTOMY     fibroids  . CARPAL TUNNEL RELEASE Bilateral   . CHOLECYSTECTOMY      Social History   Socioeconomic History  . Marital status: Widowed    Spouse name: Not on file  . Number of children: Not on file  . Years of education: Not on file  . Highest education level: Not on file  Occupational History  . Not on file  Social Needs  . Financial resource strain: Not on file  . Food insecurity    Worry: Not on file    Inability: Not on file  . Transportation needs    Medical: Not on file    Non-medical: Not on file  Tobacco Use  . Smoking status: Never Smoker  . Smokeless tobacco: Never Used  Substance and Sexual Activity  . Alcohol use: Yes    Comment: occasional, holidays  . Drug use: No  . Sexual activity: Not on file  Lifestyle  . Physical activity    Days per week: Not on file    Minutes per session: Not on file  . Stress:  Not on file  Relationships  . Social Musician on phone: Not on file    Gets together: Not on file    Attends religious service: Not on file    Active member of club or organization: Not on file    Attends meetings of clubs or organizations: Not on file    Relationship status: Not on file  Other Topics Concern  . Not on file  Social History Narrative   She walks her dog routinely.    Previously worked as a Tree surgeon.   Right handed.   One level with son.   Caffeine - soda 1 bottle/day   Education - 2 years college    Family History  Problem Relation Age of Onset  . Diabetes Mother   . Pancreatic cancer Sister   . Hypertension Father     Review of Systems  Constitutional: Negative for chills and fever.  Respiratory: Negative for shortness of breath.   Cardiovascular: Positive for leg swelling. Negative for chest pain and palpitations.   Musculoskeletal: Positive for arthralgias and joint swelling.  Skin: Positive for color change. Negative for wound.  Neurological: Negative for light-headedness and headaches.       Objective:   Vitals:   05/01/19 1554  BP: (!) 150/80  Pulse: 89  Resp: 16  Temp: 98.7 F (37.1 C)  SpO2: 97%   BP Readings from Last 3 Encounters:  05/01/19 (!) 150/80  04/22/19 (!) 158/83  04/15/19 (!) 158/89   Wt Readings from Last 3 Encounters:  03/19/19 146 lb (66.2 kg)  03/18/19 157 lb (71.2 kg)  11/15/18 154 lb (69.9 kg)   Body mass index is 30.51 kg/m.   Physical Exam Constitutional:      General: She is not in acute distress.    Appearance: Normal appearance. She is not ill-appearing.  HENT:     Head: Normocephalic and atraumatic.  Cardiovascular:     Rate and Rhythm: Normal rate and regular rhythm.     Pulses: Normal pulses.     Heart sounds: Normal heart sounds.  Musculoskeletal:     Right lower leg: Edema present.     Left lower leg: Edema present.     Comments: Right foot and distal lower leg swelling, tenderness at first MTP with pain with passive and active movement.  Left ankle and proximal foot swelling.  No distal foot swelling.  Pain with passive/active movement of ankle  Skin:    General: Skin is warm and dry.     Findings: Erythema (dorsal aspect of right foot - first - 3rd MTP; left lateral malleolous) present. No lesion.  Neurological:     Mental Status: She is alert.     Sensory: No sensory deficit.            Assessment & Plan:    See Problem List for Assessment and Plan of chronic medical problems.

## 2019-05-01 NOTE — Patient Instructions (Addendum)
Have blood work done today.  Take the tramadol (pain medication ) for severe pain - only take as needed.   Take tylenol as needed for pain.   Take the prednisone as prescribed - start today and then tomorrow and after that take with breakfast.  Always take with food.    Please call if there is no improvement in your symptoms.     Gout  Gout is a condition that causes painful swelling of the joints. Gout is a type of inflammation of the joints (arthritis). This condition is caused by having too much uric acid in the body. Uric acid is a chemical that forms when the body breaks down substances called purines. Purines are important for building body proteins. When the body has too much uric acid, sharp crystals can form and build up inside the joints. This causes pain and swelling. Gout attacks can happen quickly and may be very painful (acute gout). Over time, the attacks can affect more joints and become more frequent (chronic gout). Gout can also cause uric acid to build up under the skin and inside the kidneys. What are the causes? This condition is caused by too much uric acid in your blood. This can happen because:  Your kidneys do not remove enough uric acid from your blood. This is the most common cause.  Your body makes too much uric acid. This can happen with some cancers and cancer treatments. It can also occur if your body is breaking down too many red blood cells (hemolytic anemia).  You eat too many foods that are high in purines. These foods include organ meats and some seafood. Alcohol, especially beer, is also high in purines. A gout attack may be triggered by trauma or stress. What increases the risk? You are more likely to develop this condition if you:  Have a family history of gout.  Are female and middle-aged.  Are female and have gone through menopause.  Are obese.  Frequently drink alcohol, especially beer.  Are dehydrated.  Lose weight too quickly.  Have  an organ transplant.  Have lead poisoning.  Take certain medicines, including aspirin, cyclosporine, diuretics, levodopa, and niacin.  Have kidney disease.  Have a skin condition called psoriasis. What are the signs or symptoms? An attack of acute gout happens quickly. It usually occurs in just one joint. The most common place is the big toe. Attacks often start at night. Other joints that may be affected include joints of the feet, ankle, knee, fingers, wrist, or elbow. Symptoms of this condition may include:  Severe pain.  Warmth.  Swelling.  Stiffness.  Tenderness. The affected joint may be very painful to touch.  Shiny, red, or purple skin.  Chills and fever. Chronic gout may cause symptoms more frequently. More joints may be involved. You may also have white or yellow lumps (tophi) on your hands or feet or in other areas near your joints. How is this diagnosed? This condition is diagnosed based on your symptoms, medical history, and physical exam. You may have tests, such as:  Blood tests to measure uric acid levels.  Removal of joint fluid with a thin needle (aspiration) to look for uric acid crystals.  X-rays to look for joint damage. How is this treated? Treatment for this condition has two phases: treating an acute attack and preventing future attacks. Acute gout treatment may include medicines to reduce pain and swelling, including:  NSAIDs.  Steroids. These are strong anti-inflammatory medicines that can be  taken by mouth (orally) or injected into a joint.  Colchicine. This medicine relieves pain and swelling when it is taken soon after an attack. It can be given by mouth or through an IV. Preventive treatment may include:  Daily use of smaller doses of NSAIDs or colchicine.  Use of a medicine that reduces uric acid levels in your blood.  Changes to your diet. You may need to see a dietitian about what to eat and drink to prevent gout. Follow these  instructions at home: During a gout attack   If directed, put ice on the affected area: ? Put ice in a plastic bag. ? Place a towel between your skin and the bag. ? Leave the ice on for 20 minutes, 2-3 times a day.  Raise (elevate) the affected joint above the level of your heart as often as possible.  Rest the joint as much as possible. If the affected joint is in your leg, you may be given crutches to use.  Follow instructions from your health care provider about eating or drinking restrictions. Avoiding future gout attacks  Follow a low-purine diet as told by your dietitian or health care provider. Avoid foods and drinks that are high in purines, including liver, kidney, anchovies, asparagus, herring, mushrooms, mussels, and beer.  Maintain a healthy weight or lose weight if you are overweight. If you want to lose weight, talk with your health care provider. It is important that you do not lose weight too quickly.  Start or maintain an exercise program as told by your health care provider. Eating and drinking  Drink enough fluids to keep your urine pale yellow.  If you drink alcohol: ? Limit how much you use to:  0-1 drink a day for women.  0-2 drinks a day for men. ? Be aware of how much alcohol is in your drink. In the [place] one drink equals one 12 oz bottle of beer (355 mL) one 5 oz glass of wine (148 mL), or one 1 oz glass of hard liquor (44 mL). General instructions  Take over-the-counter and prescription medicines only as told by your health care provider.  Do not drive or use heavy machinery while taking prescription pain medicine.  Return to your normal activities as told by your health care provider. Ask your health care provider what activities are safe for you.  Keep all follow-up visits as told by your health care provider. This is important. Contact a health care provider if you have:  Another gout attack.  Continuing symptoms of a gout attack after 10  days of treatment.  Side effects from your medicines.  Chills or a fever.  Burning pain when you urinate.  Pain in your lower back or belly. Get help right away if you:  Have severe or uncontrolled pain.  Cannot urinate. Summary  Gout is painful swelling of the joints caused by inflammation.  The most common site of pain is the big toe, but it can affect other joints in the body.  Medicines and dietary changes can help to prevent and treat gout attacks. This information is not intended to replace advice given to you by your health care provider. Make sure you discuss any questions you have with your health care provider. Document Released: 05/13/2000 Document Revised: 12/06/2017 Document Reviewed: 12/06/2017 Elsevier Patient Education  2020 [employer].

## 2019-05-09 ENCOUNTER — Ambulatory Visit: Payer: Medicare PPO | Admitting: Podiatry

## 2019-05-17 ENCOUNTER — Other Ambulatory Visit: Payer: Self-pay | Admitting: Internal Medicine

## 2019-06-23 NOTE — Progress Notes (Signed)
This encounter was created in error - please disregard.

## 2019-07-16 ENCOUNTER — Ambulatory Visit: Payer: Medicare PPO | Admitting: Podiatry

## 2019-07-30 ENCOUNTER — Ambulatory Visit: Payer: Medicare PPO | Admitting: Podiatry

## 2019-08-16 ENCOUNTER — Other Ambulatory Visit: Payer: Self-pay | Admitting: Internal Medicine

## 2019-09-03 ENCOUNTER — Other Ambulatory Visit: Payer: Self-pay | Admitting: Internal Medicine

## 2019-09-16 NOTE — Patient Instructions (Addendum)
°  Blood work was ordered.   ° ° °Medications reviewed and updated.  Changes include :   none ° ° ° °Please followup in 6 months ° ° °

## 2019-09-16 NOTE — Progress Notes (Signed)
Subjective:    Patient ID: Wendy Rogers, female    DOB: 1940/10/16, 79 y.o.   MRN: [id number]  HPI The patient is here for follow up of their chronic medical problems, including diabetes, hypertension, neuropathy, hyperlipidemia  She is taking all of her medications as prescribed.   She is exercising  - walkig some times.     She has no concerns.   Medications and allergies reviewed with patient and updated if appropriate.  Patient Active Problem List   Diagnosis Date Noted  . History of gout 05/01/2019  . Arthralgia 05/01/2019  . Hyperlipidemia 08/15/2018  . Acute right-sided thoracic back pain 02/15/2018  . Chest heaviness 02/15/2018  . Ear discomfort, left 06/22/2017  . Ringing in ear, left 06/22/2017  . Low TSH level 06/22/2017  . Skin abnormalities 05/15/2017  . Peripheral neuropathy 07/04/2016  . Foot pain, bilateral 07/04/2016  . Low vitamin B12 level 07/04/2016  . Essential hypertension, benign 12/29/2015  . Diabetes (HCC) 12/29/2015  . Dry eyes 12/29/2015    Current Outpatient Medications on File Prior to Visit  Medication Sig Dispense Refill  . amLODipine (NORVASC) 5 MG tablet TAKE 1 AND 1/2 TABLETS(7.5 MG) BY MOUTH DAILY 135 tablet 1  . gabapentin (NEURONTIN) 100 MG capsule TAKE 1 CAPSULE(100 MG) BY MOUTH TWICE DAILY 60 capsule 1  . losartan (COZAAR) 50 MG tablet TAKE 1 TABLET(50 MG) BY MOUTH DAILY 90 tablet 1  . vitamin B-12 (CYANOCOBALAMIN) 1000 MCG tablet Take 1 tablet (1,000 mcg total) by mouth daily.    . metFORMIN (GLUCOPHAGE) 500 MG tablet Take 1 tablet (500 mg total) by mouth daily for 1 dose. With dinner 90 tablet 1   No current facility-administered medications on file prior to visit.    Past Medical History:  Diagnosis Date  . Diabetes mellitus without complication (HCC)   . Hypertension     Past Surgical History:  Procedure Laterality Date  . ABDOMINAL HYSTERECTOMY     fibroids  . CARPAL TUNNEL RELEASE Bilateral   .  CHOLECYSTECTOMY      Social History   Socioeconomic History  . Marital status: Widowed    Spouse name: Not on file  . Number of children: Not on file  . Years of education: Not on file  . Highest education level: Not on file  Occupational History  . Not on file  Tobacco Use  . Smoking status: Never Smoker  . Smokeless tobacco: Never Used  Substance and Sexual Activity  . Alcohol use: Yes    Comment: occasional, holidays  . Drug use: No  . Sexual activity: Not on file  Other Topics Concern  . Not on file  Social History Narrative   She walks her dog routinely.    Previously worked as a Tree surgeon.   Right handed.   One level with son.   Caffeine - soda 1 bottle/day   Education - 2 years college   Social Determinants of Corporate investment banker Strain:   . Difficulty of Paying Living Expenses:   Food Insecurity:   . Worried About Programme researcher, broadcasting/film/video in the Last Year:   . Barista in the Last Year:   Transportation Needs:   . Freight forwarder (Medical):   Marland Kitchen Lack of Transportation (Non-Medical):   Physical Activity:   . Days of Exercise per Week:   . Minutes of Exercise per Session:   Stress:   . Feeling of Stress :  Social Connections:   . Frequency of Communication with Friends and Family:   . Frequency of Social Gatherings with Friends and Family:   . Attends Religious Services:   . Active Member of Clubs or Organizations:   . Attends Archivist Meetings:   Marland Kitchen Marital Status:     Family History  Problem Relation Age of Onset  . Diabetes Mother   . Pancreatic cancer Sister   . Hypertension Father     Review of Systems  Constitutional: Negative for chills and fever.  Respiratory: Negative for cough, shortness of breath and wheezing.   Cardiovascular: Negative for chest pain, palpitations and leg swelling.  Neurological: Negative for light-headedness and headaches.       Objective:   Vitals:   09/17/19 1106  BP: (!)  142/90  Pulse: 84  Resp: 16  Temp: 98.4 F (36.9 C)  SpO2: 98%   BP Readings from Last 3 Encounters:  09/17/19 (!) 142/90  05/01/19 (!) 150/80  04/22/19 (!) 158/83   Wt Readings from Last 3 Encounters:  09/17/19 153 lb 3.2 oz (69.5 kg)  03/19/19 146 lb (66.2 kg)  03/18/19 157 lb (71.2 kg)   Body mass index is 32.02 kg/m.   Physical Exam    Constitutional: Appears well-developed and well-nourished. No distress.  HENT:  Head: Normocephalic and atraumatic.  Neck: Neck supple. No tracheal deviation present. No thyromegaly present.  No cervical lymphadenopathy Cardiovascular: Normal rate, regular rhythm and normal heart sounds.   No murmur heard. No carotid bruit .  No edema Pulmonary/Chest: Effort normal and breath sounds normal. No respiratory distress. No has no wheezes. No rales.  Skin: Skin is warm and dry. Not diaphoretic.  Psychiatric: Normal mood and affect. Behavior is normal.      Assessment & Plan:    See Problem List for Assessment and Plan of chronic medical problems.    This visit occurred during the SARS-CoV-2 public health emergency.  Safety protocols were in place, including screening questions prior to the visit, additional usage of staff PPE, and extensive cleaning of exam room while observing appropriate contact time as indicated for disinfecting solutions.

## 2019-09-17 ENCOUNTER — Encounter: Payer: Self-pay | Admitting: Internal Medicine

## 2019-09-17 ENCOUNTER — Ambulatory Visit: Payer: Medicare PPO | Admitting: Internal Medicine

## 2019-09-17 ENCOUNTER — Other Ambulatory Visit: Payer: Self-pay

## 2019-09-17 VITALS — BP 142/90 | HR 84 | Temp 98.4°F | Resp 16 | Ht <= 58 in | Wt 153.2 lb

## 2019-09-17 DIAGNOSIS — Z8739 Personal history of other diseases of the musculoskeletal system and connective tissue: Secondary | ICD-10-CM

## 2019-09-17 DIAGNOSIS — G6289 Other specified polyneuropathies: Secondary | ICD-10-CM

## 2019-09-17 DIAGNOSIS — I10 Essential (primary) hypertension: Secondary | ICD-10-CM | POA: Diagnosis not present

## 2019-09-17 DIAGNOSIS — E782 Mixed hyperlipidemia: Secondary | ICD-10-CM | POA: Diagnosis not present

## 2019-09-17 DIAGNOSIS — E119 Type 2 diabetes mellitus without complications: Secondary | ICD-10-CM | POA: Diagnosis not present

## 2019-09-17 LAB — HEMOGLOBIN A1C: Hgb A1c MFr Bld: 5.7 % (ref 4.6–6.5)

## 2019-09-17 LAB — CBC WITH DIFFERENTIAL/PLATELET
Basophils Absolute: 0 10*3/uL (ref 0.0–0.1)
Basophils Relative: 0.5 % (ref 0.0–3.0)
Eosinophils Absolute: 0 10*3/uL (ref 0.0–0.7)
Eosinophils Relative: 1 % (ref 0.0–5.0)
HCT: 33.8 % — ABNORMAL LOW (ref 36.0–46.0)
Hemoglobin: 11 g/dL — ABNORMAL LOW (ref 12.0–15.0)
Lymphocytes Relative: 47.1 % — ABNORMAL HIGH (ref 12.0–46.0)
Lymphs Abs: 2.2 10*3/uL (ref 0.7–4.0)
MCHC: 32.6 g/dL (ref 30.0–36.0)
MCV: 87.8 fl (ref 78.0–100.0)
Monocytes Absolute: 0.4 10*3/uL (ref 0.1–1.0)
Monocytes Relative: 9.4 % (ref 3.0–12.0)
Neutro Abs: 1.9 10*3/uL (ref 1.4–7.7)
Neutrophils Relative %: 42 % — ABNORMAL LOW (ref 43.0–77.0)
Platelets: 233 10*3/uL (ref 150.0–400.0)
RBC: 3.85 Mil/uL — ABNORMAL LOW (ref 3.87–5.11)
RDW: 15.5 % (ref 11.5–15.5)
WBC: 4.6 10*3/uL (ref 4.0–10.5)

## 2019-09-17 LAB — COMPREHENSIVE METABOLIC PANEL
ALT: 16 U/L (ref 0–35)
AST: 20 U/L (ref 0–37)
Albumin: 4.2 g/dL (ref 3.5–5.2)
Alkaline Phosphatase: 75 U/L (ref 39–117)
BUN: 24 mg/dL — ABNORMAL HIGH (ref 6–23)
CO2: 25 mEq/L (ref 19–32)
Calcium: 9.6 mg/dL (ref 8.4–10.5)
Chloride: 108 mEq/L (ref 96–112)
Creatinine, Ser: 0.85 mg/dL (ref 0.40–1.20)
GFR: 78.01 mL/min (ref >60.00)
Glucose, Bld: 95 mg/dL (ref 70–99)
Potassium: 4.1 mEq/L (ref 3.5–5.1)
Sodium: 141 mEq/L (ref 135–145)
Total Bilirubin: 0.7 mg/dL (ref 0.2–1.2)
Total Protein: 7 g/dL (ref 6.0–8.3)

## 2019-09-17 LAB — LIPID PANEL
Cholesterol: 194 mg/dL (ref 0–200)
HDL: 68.2 mg/dL (ref >39.00)
LDL Cholesterol: 105 mg/dL — ABNORMAL HIGH (ref 0–99)
NonHDL: 125.77
Total CHOL/HDL Ratio: 3
Triglycerides: 103 mg/dL (ref 0.0–149.0)
VLDL: 20.6 mg/dL (ref 0.0–40.0)

## 2019-09-17 LAB — URIC ACID: Uric Acid, Serum: 8.6 mg/dL — ABNORMAL HIGH (ref 2.4–7.0)

## 2019-09-17 MED ORDER — METFORMIN HCL 500 MG PO TABS: 500.0000 mg | ORAL_TABLET | Freq: Every day | ORAL | 1 refills | Status: DC

## 2019-09-17 MED ORDER — AMLODIPINE BESYLATE 5 MG PO TABS: ORAL_TABLET | ORAL | 1 refills | Status: DC

## 2019-09-17 NOTE — Assessment & Plan Note (Signed)
History of gout Denies any gout symptoms We will check uric acid level Not currently on any medication for prevention

## 2019-09-17 NOTE — Assessment & Plan Note (Signed)
Chronic Taking Metformin daily Check a1c Low sugar / carb diet Stressed regular exercise

## 2019-09-17 NOTE — Assessment & Plan Note (Signed)
Chronic Symptoms improved with gabapentin 100 mg twice daily We will continue current dose

## 2019-09-17 NOTE — Assessment & Plan Note (Signed)
Chronic She has not wanted to take a statin in the past Discussed that she is at a much increased risk because she has diabetes and that she should be on a cholesterol medication Will check lipid panel today and recommend taking a statin Encouraged regular exercise, healthy diet CMP, lipids today

## 2019-09-17 NOTE — Assessment & Plan Note (Signed)
Chronic BP controlled Current regimen effective and well tolerated Continue current medications at current doses cmp  

## 2019-10-04 ENCOUNTER — Ambulatory Visit: Payer: Medicare PPO | Admitting: Internal Medicine

## 2019-10-07 ENCOUNTER — Other Ambulatory Visit: Payer: Self-pay

## 2019-10-07 ENCOUNTER — Ambulatory Visit (INDEPENDENT_AMBULATORY_CARE_PROVIDER_SITE_OTHER): Payer: Medicare PPO | Admitting: Podiatry

## 2019-10-07 DIAGNOSIS — E1142 Type 2 diabetes mellitus with diabetic polyneuropathy: Secondary | ICD-10-CM

## 2019-10-08 ENCOUNTER — Encounter: Payer: Self-pay | Admitting: Podiatry

## 2019-10-08 NOTE — Progress Notes (Signed)
Patient left without being seen. No charge.

## 2019-10-11 ENCOUNTER — Other Ambulatory Visit: Payer: Self-pay | Admitting: Internal Medicine

## 2019-10-11 DIAGNOSIS — Z1231 Encounter for screening mammogram for malignant neoplasm of breast: Secondary | ICD-10-CM

## 2019-11-19 ENCOUNTER — Other Ambulatory Visit: Payer: Self-pay

## 2019-11-19 ENCOUNTER — Ambulatory Visit
Admission: RE | Admit: 2019-11-19 | Discharge: 2019-11-19 | Disposition: A | Discharge status: Home / Self Care | Payer: Medicare PPO | Source: Ambulatory Visit | Attending: Internal Medicine | Admitting: Internal Medicine

## 2019-11-19 DIAGNOSIS — Z1231 Encounter for screening mammogram for malignant neoplasm of breast: Secondary | ICD-10-CM

## 2019-11-27 DIAGNOSIS — D3131 Benign neoplasm of right choroid: Secondary | ICD-10-CM | POA: Diagnosis not present

## 2019-11-27 DIAGNOSIS — H26493 Other secondary cataract, bilateral: Secondary | ICD-10-CM | POA: Diagnosis not present

## 2019-11-27 DIAGNOSIS — Z961 Presence of intraocular lens: Secondary | ICD-10-CM | POA: Diagnosis not present

## 2019-11-27 DIAGNOSIS — H04123 Dry eye syndrome of bilateral lacrimal glands: Secondary | ICD-10-CM | POA: Diagnosis not present

## 2019-11-27 DIAGNOSIS — E119 Type 2 diabetes mellitus without complications: Secondary | ICD-10-CM | POA: Diagnosis not present

## 2019-12-26 ENCOUNTER — Other Ambulatory Visit: Payer: Self-pay

## 2019-12-26 ENCOUNTER — Telehealth: Payer: Self-pay | Admitting: Internal Medicine

## 2019-12-26 MED ORDER — LOSARTAN POTASSIUM 50 MG PO TABS: ORAL_TABLET | ORAL | 1 refills | Status: DC

## 2019-12-26 NOTE — Telephone Encounter (Signed)
Faxed in today. 

## 2019-12-26 NOTE — Telephone Encounter (Signed)
    [facility] mail delivery requesting order for losartan (COZAAR) 50 MG tablet

## 2020-01-23 NOTE — Progress Notes (Signed)
Subjective:    Patient ID: Wendy Rogers, female    DOB: 1941-04-13, 79 y.o.   MRN: [id number]  HPI The patient is here for an acute visit.   Left shoulder pain:  She had a fall 2 weeks ago.  She was playing tug of war with her puppy and lost her balance and fell. When she fell she hit her elbow and did not hit her shoulder.  Initially she did not have pain.  A couple of days later she started having pain in her shoulder, especially on the top which is bruised.  She also has pain in her upper arm.  She states her shoulder is swollen.  She has pain with movement and has decreased ROM.  She has iced it, but has not taken any medication for it.  Medications and allergies reviewed with patient and updated if appropriate.  Patient Active Problem List   Diagnosis Date Noted  . Left shoulder pain 01/24/2020  . History of gout 05/01/2019  . Arthralgia 05/01/2019  . Hyperlipidemia 08/15/2018  . Acute right-sided thoracic back pain 02/15/2018  . Chest heaviness 02/15/2018  . Ear discomfort, left 06/22/2017  . Ringing in ear, left 06/22/2017  . Low TSH level 06/22/2017  . Skin abnormalities 05/15/2017  . Peripheral neuropathy 07/04/2016  . Foot pain, bilateral 07/04/2016  . Low vitamin B12 level 07/04/2016  . Essential hypertension, benign 12/29/2015  . Diabetes (HCC) 12/29/2015  . Dry eyes 12/29/2015    Current Outpatient Medications on File Prior to Visit  Medication Sig Dispense Refill  . amLODipine (NORVASC) 5 MG tablet TAKE 1 AND 1/2 TABLETS(7.5 MG) BY MOUTH DAILY 135 tablet 1  . gabapentin (NEURONTIN) 100 MG capsule TAKE 1 CAPSULE(100 MG) BY MOUTH TWICE DAILY 60 capsule 1  . losartan (COZAAR) 50 MG tablet TAKE 1 TABLET(50 MG) BY MOUTH DAILY 90 tablet 1  . metFORMIN (GLUCOPHAGE) 500 MG tablet Take 1 tablet (500 mg total) by mouth daily. With dinner 90 tablet 1  . vitamin B-12 (CYANOCOBALAMIN) 1000 MCG tablet Take 1 tablet (1,000 mcg total) by mouth daily.     No current  facility-administered medications on file prior to visit.    Past Medical History:  Diagnosis Date  . Diabetes mellitus without complication (HCC)   . Hypertension     Past Surgical History:  Procedure Laterality Date  . ABDOMINAL HYSTERECTOMY     fibroids  . CARPAL TUNNEL RELEASE Bilateral   . CHOLECYSTECTOMY      Social History   Socioeconomic History  . Marital status: Widowed    Spouse name: Not on file  . Number of children: Not on file  . Years of education: Not on file  . Highest education level: Not on file  Occupational History  . Not on file  Tobacco Use  . Smoking status: Never Smoker  . Smokeless tobacco: Never Used  Substance and Sexual Activity  . Alcohol use: Yes    Comment: occasional, holidays  . Drug use: No  . Sexual activity: Not on file  Other Topics Concern  . Not on file  Social History Narrative   She walks her dog routinely.    Previously worked as a Tree surgeon.   Right handed.   One level with son.   Caffeine - soda 1 bottle/day   Education - 2 years college   Social Determinants of Corporate investment banker Strain:   . Difficulty of Paying Living Expenses: Not on file  Food  Insecurity:   . Worried About Programme researcher, broadcasting/film/video in the Last Year: Not on file  . Ran Out of Food in the Last Year: Not on file  Transportation Needs:   . Lack of Transportation (Medical): Not on file  . Lack of Transportation (Non-Medical): Not on file  Physical Activity:   . Days of Exercise per Week: Not on file  . Minutes of Exercise per Session: Not on file  Stress:   . Feeling of Stress : Not on file  Social Connections:   . Frequency of Communication with Friends and Family: Not on file  . Frequency of Social Gatherings with Friends and Family: Not on file  . Attends Religious Services: Not on file  . Active Member of Clubs or Organizations: Not on file  . Attends Banker Meetings: Not on file  . Marital Status: Not on file     Family History  Problem Relation Age of Onset  . Diabetes Mother   . Pancreatic cancer Sister   . Hypertension Father     Review of Systems  Constitutional: Negative for fever.  Musculoskeletal: Positive for joint swelling (Left shoulder). Negative for neck pain and neck stiffness.       No elbow pain  Neurological: Positive for weakness (Proximal left arm/shoulder). Negative for numbness.  Hematological: Bruises/bleeds easily.       Objective:   Vitals:   01/24/20 1323  BP: 122/76  Pulse: 100  Temp: 98 F (36.7 C)  SpO2: 98%   BP Readings from Last 3 Encounters:  01/24/20 122/76  09/17/19 (!) 142/90  05/01/19 (!) 150/80   Wt Readings from Last 3 Encounters:  01/24/20 151 lb (68.5 kg)  09/17/19 153 lb 3.2 oz (69.5 kg)  03/19/19 146 lb (66.2 kg)   Body mass index is 31.56 kg/m.   Physical Exam    A left shoulder exam was performed.   SWELLING: Mild EFFUSION: Mild WARMTH: no warmth  TENDERNESS:  tenderness top of and lateral aspect of shoulder joint  ROM: Decreased ROM with pain in all directions NEUROLOGICAL EXAM: normal sensation and strength  PULSES: normal        Assessment & Plan:    See Problem List for Assessment and Plan of chronic medical problems.    This visit occurred during the SARS-CoV-2 public health emergency.  Safety protocols were in place, including screening questions prior to the visit, additional usage of staff PPE, and extensive cleaning of exam room while observing appropriate contact time as indicated for disinfecting solutions.

## 2020-01-24 ENCOUNTER — Encounter: Payer: Self-pay | Admitting: Internal Medicine

## 2020-01-24 ENCOUNTER — Ambulatory Visit: Payer: Medicare PPO | Admitting: Internal Medicine

## 2020-01-24 ENCOUNTER — Other Ambulatory Visit: Payer: Self-pay

## 2020-01-24 ENCOUNTER — Ambulatory Visit (INDEPENDENT_AMBULATORY_CARE_PROVIDER_SITE_OTHER): Payer: Medicare PPO

## 2020-01-24 DIAGNOSIS — M25512 Pain in left shoulder: Secondary | ICD-10-CM | POA: Insufficient documentation

## 2020-01-24 DIAGNOSIS — S4992XA Unspecified injury of left shoulder and upper arm, initial encounter: Secondary | ICD-10-CM | POA: Diagnosis not present

## 2020-01-24 NOTE — Assessment & Plan Note (Signed)
Acute Occurred after fall 2 weeks ago Refer to sports medicine X-ray today Continue to ice Take Tylenol as needed

## 2020-01-24 NOTE — Patient Instructions (Signed)
Have an xray today downstairs.    Continue to ice.  Continue to move the shoulder but not enough to cause pain.  Take tylenol if needed.    Make an appointment with sports medicine downstairs.

## 2020-01-27 ENCOUNTER — Telehealth: Payer: Self-pay

## 2020-01-27 NOTE — Telephone Encounter (Addendum)
Message shared with son to give to patient.  ----- Message from Pincus Sanes, MD sent at 01/25/2020 10:28 AM EDT ----- Xray of shoulder shows no fracture or dislocation

## 2020-01-28 ENCOUNTER — Ambulatory Visit: Payer: Medicare PPO | Admitting: Family Medicine

## 2020-01-28 NOTE — Progress Notes (Signed)
No show

## 2020-02-19 ENCOUNTER — Other Ambulatory Visit: Payer: Self-pay | Admitting: Internal Medicine

## 2020-03-23 NOTE — Progress Notes (Signed)
Subjective:    Patient ID: Wendy Rogers, female    DOB: 08/26/1940, 79 y.o.   MRN: [id number]   This visit occurred during the SARS-CoV-2 public health emergency.  Safety protocols were in place, including screening questions prior to the visit, additional usage of staff PPE, and extensive cleaning of exam room while observing appropriate contact time as indicated for disinfecting solutions.    HPI She is here for a physical exam.   She denies any changes since she was here last.  She did get a different looking medication from the pharmacy and wanted to make sure was okay for her to take.  She is still having some left shoulder pain, but she is doing exercises and applying ice.  We reviewed her x-ray.  She does not feel like she needs to see a specialist at this time.  Medications and allergies reviewed with patient and updated if appropriate.  Patient Active Problem List   Diagnosis Date Noted  . Left shoulder pain 01/24/2020  . History of gout 05/01/2019  . Arthralgia 05/01/2019  . Hyperlipidemia 08/15/2018  . Acute right-sided thoracic back pain 02/15/2018  . Chest heaviness 02/15/2018  . Ear discomfort, left 06/22/2017  . Ringing in ear, left 06/22/2017  . Low TSH level 06/22/2017  . Skin abnormalities 05/15/2017  . Peripheral neuropathy 07/04/2016  . Foot pain, bilateral 07/04/2016  . Low vitamin B12 level 07/04/2016  . Essential hypertension, benign 12/29/2015  . Diabetes (HCC) 12/29/2015  . Dry eyes 12/29/2015    Current Outpatient Medications on File Prior to Visit  Medication Sig Dispense Refill  . amLODipine (NORVASC) 5 MG tablet TAKE 1 AND 1/2 TABLETS(7.5 MG) BY MOUTH DAILY 135 tablet 1  . gabapentin (NEURONTIN) 100 MG capsule TAKE 1 CAPSULE(100 MG) BY MOUTH TWICE DAILY 60 capsule 1  . losartan (COZAAR) 50 MG tablet TAKE 1 TABLET(50 MG) BY MOUTH DAILY 90 tablet 1  . metFORMIN (GLUCOPHAGE) 500 MG tablet Take 1 tablet (500 mg total) by mouth daily.  With dinner 90 tablet 1  . vitamin B-12 (CYANOCOBALAMIN) 1000 MCG tablet Take 1 tablet (1,000 mcg total) by mouth daily.     No current facility-administered medications on file prior to visit.    Past Medical History:  Diagnosis Date  . Diabetes mellitus without complication (HCC)   . Hypertension     Past Surgical History:  Procedure Laterality Date  . ABDOMINAL HYSTERECTOMY     fibroids  . CARPAL TUNNEL RELEASE Bilateral   . CHOLECYSTECTOMY      Social History   Socioeconomic History  . Marital status: Widowed    Spouse name: Not on file  . Number of children: Not on file  . Years of education: Not on file  . Highest education level: Not on file  Occupational History  . Not on file  Tobacco Use  . Smoking status: Never Smoker  . Smokeless tobacco: Never Used  Substance and Sexual Activity  . Alcohol use: Yes    Comment: occasional, holidays  . Drug use: No  . Sexual activity: Not on file  Other Topics Concern  . Not on file  Social History Narrative   She walks her dog routinely.    Previously worked as a Tree surgeon.   Right handed.   One level with son.   Caffeine - soda 1 bottle/day   Education - 2 years college   Social Determinants of Health   Financial Resource Strain:   . Difficulty of  Paying Living Expenses: Not on file  Food Insecurity:   . Worried About Programme researcher, broadcasting/film/video in the Last Year: Not on file  . Ran Out of Food in the Last Year: Not on file  Transportation Needs:   . Lack of Transportation (Medical): Not on file  . Lack of Transportation (Non-Medical): Not on file  Physical Activity:   . Days of Exercise per Week: Not on file  . Minutes of Exercise per Session: Not on file  Stress:   . Feeling of Stress : Not on file  Social Connections:   . Frequency of Communication with Friends and Family: Not on file  . Frequency of Social Gatherings with Friends and Family: Not on file  . Attends Religious Services: Not on file  . Active  Member of Clubs or Organizations: Not on file  . Attends Banker Meetings: Not on file  . Marital Status: Not on file    Family History  Problem Relation Age of Onset  . Diabetes Mother   . Pancreatic cancer Sister   . Hypertension Father     Review of Systems  Constitutional: Negative for chills and fever.  Eyes: Negative for visual disturbance.  Respiratory: Negative for cough, shortness of breath and wheezing.   Cardiovascular: Negative for chest pain, palpitations and leg swelling.  Gastrointestinal: Positive for diarrhea (occ). Negative for abdominal pain, blood in stool, constipation and nausea.       No gerd  Endocrine: Positive for cold intolerance.  Genitourinary: Negative for dysuria and hematuria.  Musculoskeletal: Positive for arthralgias (left shoulder). Negative for back pain.  Skin: Negative for color change and rash.  Neurological: Negative for dizziness, light-headedness and headaches.  Psychiatric/Behavioral: Negative for dysphoric mood. The patient is not nervous/anxious.        Objective:   Vitals:   03/24/20 1117  BP: 138/70  Pulse: 93  Temp: 98 F (36.7 C)  SpO2: 98%   Filed Weights   03/24/20 1117  Weight: 150 lb 12.8 oz (68.4 kg)   Body mass index is 31.52 kg/m.  BP Readings from Last 3 Encounters:  03/24/20 138/70  01/24/20 122/76  09/17/19 (!) 142/90    Wt Readings from Last 3 Encounters:  03/24/20 150 lb 12.8 oz (68.4 kg)  01/24/20 151 lb (68.5 kg)  09/17/19 153 lb 3.2 oz (69.5 kg)     Physical Exam Constitutional: She appears well-developed and well-nourished. No distress.  HENT:  Head: Normocephalic and atraumatic.  Right Ear: External ear normal. Normal ear canal and TM Left Ear: External ear normal.  Normal ear canal and TM Mouth/Throat: Oropharynx is clear and moist.  Eyes: Conjunctivae and EOM are normal.  Neck: Neck supple. No tracheal deviation present. No thyromegaly present.  No carotid bruit    Cardiovascular: Normal rate, regular rhythm and normal heart sounds.   No murmur heard.  No edema. Pulmonary/Chest: Effort normal and breath sounds normal. No respiratory distress. She has no wheezes. She has no rales.  Breast: deferred   Abdominal: Soft. She exhibits no distension. There is no tenderness.  Lymphadenopathy: She has no cervical adenopathy.  Skin: Skin is warm and dry. She is not diaphoretic.  Psychiatric: She has a normal mood and affect. Her behavior is normal.        Assessment & Plan:   Physical exam: Screening blood work    ordered Immunizations  Discussed prevnar, flu, tdap, shingrix-she may get the Shingrix at some time at her pharmacy, but  deferred all other. Colonoscopy  Aged out Mammogram  Up to date  Gyn  n/a Dexa  Deferred for now - will do at a later time Eye exams  Due - will schedule Exercise  Walking daily, arm exercise Weight  encouraged weight loss Substance abuse  none      See Problem List for Assessment and Plan of chronic medical problems.

## 2020-03-23 NOTE — Patient Instructions (Addendum)
Blood work was ordered.    All other Health Maintenance issues reviewed.   All recommended immunizations and age-appropriate screenings are up-to-date or discussed.    Medications reviewed and updated.  Changes include :   none  Your prescription(s) have been submitted to your pharmacy. Please take as directed and contact our office if you believe you are having problem(s) with the medication(s).  A referral was ordered for podiatry.      Someone will call you to schedule an appointment.    Please followup in 6 months    Health Maintenance, Female Adopting a healthy lifestyle and getting preventive care are important in promoting health and wellness. Ask your health care provider about:  The right schedule for you to have regular tests and exams.  Things you can do on your own to prevent diseases and keep yourself healthy. What should I know about diet, weight, and exercise? Eat a healthy diet   Eat a diet that includes plenty of vegetables, fruits, low-fat dairy products, and lean protein.  Do not eat a lot of foods that are high in solid fats, added sugars, or sodium. Maintain a healthy weight Body mass index (BMI) is used to identify weight problems. It estimates body fat based on height and weight. Your health care provider can help determine your BMI and help you achieve or maintain a healthy weight. Get regular exercise Get regular exercise. This is one of the most important things you can do for your health. Most adults should:  Exercise for at least 150 minutes each week. The exercise should increase your heart rate and make you sweat (moderate-intensity exercise).  Do strengthening exercises at least twice a week. This is in addition to the moderate-intensity exercise.  Spend less time sitting. Even light physical activity can be beneficial. Watch cholesterol and blood lipids Have your blood tested for lipids and cholesterol at 79 years of age, then have this test  every 5 years. Have your cholesterol levels checked more often if:  Your lipid or cholesterol levels are high.  You are older than 79 years of age.  You are at high risk for heart disease. What should I know about cancer screening? Depending on your health history and family history, you may need to have cancer screening at various ages. This may include screening for:  Breast cancer.  Cervical cancer.  Colorectal cancer.  Skin cancer.  Lung cancer. What should I know about heart disease, diabetes, and high blood pressure? Blood pressure and heart disease  High blood pressure causes heart disease and increases the risk of stroke. This is more likely to develop in people who have high blood pressure readings, are of African descent, or are overweight.  Have your blood pressure checked: ? Every 3-5 years if you are 1-37 years of age. ? Every year if you are 69 years old or older. Diabetes Have regular diabetes screenings. This checks your fasting blood sugar level. Have the screening done:  Once every three years after age 56 if you are at a normal weight and have a low risk for diabetes.  More often and at a younger age if you are overweight or have a high risk for diabetes. What should I know about preventing infection? Hepatitis B If you have a higher risk for hepatitis B, you should be screened for this virus. Talk with your health care provider to find out if you are at risk for hepatitis B infection. Hepatitis C Testing is recommended  for:  Everyone born from 1945 through 1965.  Anyone with known risk factors for hepatitis C. Sexually transmitted infections (STIs)  Get screened for STIs, including gonorrhea and chlamydia, if: ? You are sexually active and are younger than 79 years of age. ? You are older than 79 years of age and your health care provider tells you that you are at risk for this type of infection. ? Your sexual activity has changed since you were  last screened, and you are at increased risk for chlamydia or gonorrhea. Ask your health care provider if you are at risk.  Ask your health care provider about whether you are at high risk for HIV. Your health care provider may recommend a prescription medicine to help prevent HIV infection. If you choose to take medicine to prevent HIV, you should first get tested for HIV. You should then be tested every 3 months for as long as you are taking the medicine. Pregnancy  If you are about to stop having your period (premenopausal) and you may become pregnant, seek counseling before you get pregnant.  Take 400 to 800 micrograms (mcg) of folic acid every day if you become pregnant.  Ask for birth control (contraception) if you want to prevent pregnancy. Osteoporosis and menopause Osteoporosis is a disease in which the bones lose minerals and strength with aging. This can result in bone fractures. If you are 65 years old or older, or if you are at risk for osteoporosis and fractures, ask your health care provider if you should:  Be screened for bone loss.  Take a calcium or vitamin D supplement to lower your risk of fractures.  Be given hormone replacement therapy (HRT) to treat symptoms of menopause. Follow these instructions at home: Lifestyle  Do not use any products that contain nicotine or tobacco, such as cigarettes, e-cigarettes, and chewing tobacco. If you need help quitting, ask your health care provider.  Do not use street drugs.  Do not share needles.  Ask your health care provider for help if you need support or information about quitting drugs. Alcohol use  Do not drink alcohol if: ? Your health care provider tells you not to drink. ? You are pregnant, may be pregnant, or are planning to become pregnant.  If you drink alcohol: ? Limit how much you use to 0-1 drink a day. ? Limit intake if you are breastfeeding.  Be aware of how much alcohol is in your drink. In the [place]  one drink equals one 12 oz bottle of beer (355 mL), one 5 oz glass of wine (148 mL), or one 1 oz glass of hard liquor (44 mL). General instructions  Schedule regular health, dental, and eye exams.  Stay current with your vaccines.  Tell your health care provider if: ? You often feel depressed. ? You have ever been abused or do not feel safe at home. Summary  Adopting a healthy lifestyle and getting preventive care are important in promoting health and wellness.  Follow your health care provider's instructions about healthy diet, exercising, and getting tested or screened for diseases.  Follow your health care provider's instructions on monitoring your cholesterol and blood pressure. This information is not intended to replace advice given to you by your health care provider. Make sure you discuss any questions you have with your health care provider. Document Revised: 05/09/2018 Document Reviewed: 05/09/2018 Elsevier Patient Education  2020 Elsevier Inc.  

## 2020-03-24 ENCOUNTER — Other Ambulatory Visit: Payer: Self-pay

## 2020-03-24 ENCOUNTER — Encounter: Payer: Self-pay | Admitting: Internal Medicine

## 2020-03-24 ENCOUNTER — Ambulatory Visit (INDEPENDENT_AMBULATORY_CARE_PROVIDER_SITE_OTHER): Payer: Medicare PPO | Admitting: Internal Medicine

## 2020-03-24 VITALS — BP 138/70 | HR 93 | Temp 98.0°F | Ht <= 58 in | Wt 150.8 lb

## 2020-03-24 DIAGNOSIS — I10 Essential (primary) hypertension: Secondary | ICD-10-CM | POA: Diagnosis not present

## 2020-03-24 DIAGNOSIS — E782 Mixed hyperlipidemia: Secondary | ICD-10-CM | POA: Diagnosis not present

## 2020-03-24 DIAGNOSIS — Z0000 Encounter for general adult medical examination without abnormal findings: Secondary | ICD-10-CM

## 2020-03-24 DIAGNOSIS — M25512 Pain in left shoulder: Secondary | ICD-10-CM

## 2020-03-24 DIAGNOSIS — G6289 Other specified polyneuropathies: Secondary | ICD-10-CM

## 2020-03-24 DIAGNOSIS — E538 Deficiency of other specified B group vitamins: Secondary | ICD-10-CM

## 2020-03-24 DIAGNOSIS — E1142 Type 2 diabetes mellitus with diabetic polyneuropathy: Secondary | ICD-10-CM

## 2020-03-24 LAB — CBC WITH DIFFERENTIAL/PLATELET
Basophils Absolute: 0 10*3/uL (ref 0.0–0.1)
Basophils Relative: 1.1 % (ref 0.0–3.0)
Eosinophils Absolute: 0 10*3/uL (ref 0.0–0.7)
Eosinophils Relative: 0.9 % (ref 0.0–5.0)
HCT: 35.1 % — ABNORMAL LOW (ref 36.0–46.0)
Hemoglobin: 11.5 g/dL — ABNORMAL LOW (ref 12.0–15.0)
Lymphocytes Relative: 41.5 % (ref 12.0–46.0)
Lymphs Abs: 1.7 10*3/uL (ref 0.7–4.0)
MCHC: 32.8 g/dL (ref 30.0–36.0)
MCV: 90.3 fl (ref 78.0–100.0)
Monocytes Absolute: 0.4 10*3/uL (ref 0.1–1.0)
Monocytes Relative: 8.7 % (ref 3.0–12.0)
Neutro Abs: 2 10*3/uL (ref 1.4–7.7)
Neutrophils Relative %: 47.8 % (ref 43.0–77.0)
Platelets: 243 10*3/uL (ref 150.0–400.0)
RBC: 3.89 Mil/uL (ref 3.87–5.11)
RDW: 14.6 % (ref 11.5–15.5)
WBC: 4.2 10*3/uL (ref 4.0–10.5)

## 2020-03-24 LAB — LIPID PANEL
Cholesterol: 186 mg/dL (ref 0–200)
HDL: 65.1 mg/dL (ref >39.00)
NonHDL: 120.79
Total CHOL/HDL Ratio: 3
Triglycerides: 248 mg/dL — ABNORMAL HIGH (ref 0.0–149.0)
VLDL: 49.6 mg/dL — ABNORMAL HIGH (ref 0.0–40.0)

## 2020-03-24 LAB — COMPREHENSIVE METABOLIC PANEL
ALT: 25 U/L (ref 0–35)
AST: 27 U/L (ref 0–37)
Albumin: 3.9 g/dL (ref 3.5–5.2)
Alkaline Phosphatase: 70 U/L (ref 39–117)
BUN: 17 mg/dL (ref 6–23)
CO2: 29 mEq/L (ref 19–32)
Calcium: 9.8 mg/dL (ref 8.4–10.5)
Chloride: 107 mEq/L (ref 96–112)
Creatinine, Ser: 0.83 mg/dL (ref 0.40–1.20)
GFR: 66.88 mL/min (ref >60.00)
Glucose, Bld: 105 mg/dL — ABNORMAL HIGH (ref 70–99)
Potassium: 3.3 mEq/L — ABNORMAL LOW (ref 3.5–5.1)
Sodium: 142 mEq/L (ref 135–145)
Total Bilirubin: 0.6 mg/dL (ref 0.2–1.2)
Total Protein: 6.7 g/dL (ref 6.0–8.3)

## 2020-03-24 LAB — TSH: TSH: 0.35 u[IU]/mL (ref 0.35–4.50)

## 2020-03-24 LAB — HEMOGLOBIN A1C: Hgb A1c MFr Bld: 5.6 % (ref 4.6–6.5)

## 2020-03-24 LAB — VITAMIN B12: Vitamin B-12: 323 pg/mL (ref 211–911)

## 2020-03-24 LAB — LDL CHOLESTEROL, DIRECT: Direct LDL: 94 mg/dL

## 2020-03-24 MED ORDER — METFORMIN HCL 500 MG PO TABS
500.0000 mg | ORAL_TABLET | Freq: Every day | ORAL | 1 refills | Status: DC
Start: 2020-03-24 — End: 2020-09-19

## 2020-03-24 MED ORDER — GABAPENTIN 100 MG PO CAPS
ORAL_CAPSULE | ORAL | 1 refills | Status: DC
Start: 2020-03-24 — End: 2021-03-31

## 2020-03-24 NOTE — Assessment & Plan Note (Signed)
Chronic Check lipid panel  Lifestyle controlled.  Has refused statins in the past Regular exercise and healthy diet encouraged

## 2020-03-24 NOTE — Assessment & Plan Note (Signed)
Chronic Taking gabapentin 100 mg twice daily, which is helping We will continue

## 2020-03-24 NOTE — Assessment & Plan Note (Signed)
Subacute Pain is improved She is icing and doing shoulder exercises No showed to sports medicine appointment X-ray showed mild arthritis Advised her to continue treating at home and let me know if she wanted further evaluation

## 2020-03-24 NOTE — Assessment & Plan Note (Signed)
Chronic BP well controlled Continue amlodipine 7.5 mg daily, losartan 50 mg daily cmp  

## 2020-03-24 NOTE — Assessment & Plan Note (Signed)
Chronic °Taking vitamin B12 daily °Check vitamin B12 level ° °

## 2020-03-24 NOTE — Assessment & Plan Note (Signed)
Chronic Diabetes has been controlled A1c today Continue Metformin 500 mg daily Stressed continuing regular exercise Will schedule eye appointment

## 2020-04-13 DIAGNOSIS — M19072 Primary osteoarthritis, left ankle and foot: Secondary | ICD-10-CM | POA: Diagnosis not present

## 2020-04-13 DIAGNOSIS — E1143 Type 2 diabetes mellitus with diabetic autonomic (poly)neuropathy: Secondary | ICD-10-CM | POA: Diagnosis not present

## 2020-04-13 DIAGNOSIS — I739 Peripheral vascular disease, unspecified: Secondary | ICD-10-CM | POA: Diagnosis not present

## 2020-04-13 DIAGNOSIS — M19071 Primary osteoarthritis, right ankle and foot: Secondary | ICD-10-CM | POA: Diagnosis not present

## 2020-04-13 DIAGNOSIS — M79671 Pain in right foot: Secondary | ICD-10-CM | POA: Diagnosis not present

## 2020-04-13 DIAGNOSIS — R208 Other disturbances of skin sensation: Secondary | ICD-10-CM | POA: Diagnosis not present

## 2020-04-15 ENCOUNTER — Telehealth: Payer: Self-pay | Admitting: Internal Medicine

## 2020-04-15 NOTE — Telephone Encounter (Signed)
A1C given.

## 2020-04-15 NOTE — Telephone Encounter (Signed)
Patient wondering what her A1c was  # [phone]

## 2020-04-22 DIAGNOSIS — G629 Polyneuropathy, unspecified: Secondary | ICD-10-CM | POA: Diagnosis not present

## 2020-05-14 DIAGNOSIS — I739 Peripheral vascular disease, unspecified: Secondary | ICD-10-CM | POA: Diagnosis not present

## 2020-09-14 ENCOUNTER — Ambulatory Visit: Payer: Medicare PPO | Admitting: Internal Medicine

## 2020-09-17 NOTE — Progress Notes (Signed)
Subjective:    Patient ID: Wendy Rogers, female    DOB: 03/23/1941, 80 y.o.   MRN: [id number]  HPI The patient is here for follow up of their chronic medical problems, including DM, htn, hyperlipidemia, neuropathy  She is taking all of her medications as prescribed.    She is eating a low sugars/carb diet.  She walks her dog a little, but not as much in the cooler weather.  She has no concerns.  Medications and allergies reviewed with patient and updated if appropriate.  Patient Active Problem List   Diagnosis Date Noted  . Left shoulder pain 01/24/2020  . History of gout 05/01/2019  . Arthralgia 05/01/2019  . Hyperlipidemia 08/15/2018  . Acute right-sided thoracic back pain 02/15/2018  . Chest heaviness 02/15/2018  . Ear discomfort, left 06/22/2017  . Ringing in ear, left 06/22/2017  . Low TSH level 06/22/2017  . Skin abnormalities 05/15/2017  . Diabetic polyneuropathy (HCC) 07/04/2016  . Foot pain, bilateral 07/04/2016  . Low vitamin B12 level 07/04/2016  . Essential hypertension, benign 12/29/2015  . Diabetes (HCC) 12/29/2015  . Dry eyes 12/29/2015    Current Outpatient Medications on File Prior to Visit  Medication Sig Dispense Refill  . amLODipine (NORVASC) 5 MG tablet TAKE 1 AND 1/2 TABLETS(7.5 MG) BY MOUTH DAILY 135 tablet 1  . gabapentin (NEURONTIN) 100 MG capsule TAKE 1 CAPSULE(100 MG) BY MOUTH TWICE DAILY 180 capsule 1  . losartan (COZAAR) 50 MG tablet TAKE 1 TABLET(50 MG) BY MOUTH DAILY 90 tablet 1  . metFORMIN (GLUCOPHAGE) 500 MG tablet Take 1 tablet (500 mg total) by mouth daily. With dinner 90 tablet 1  . vitamin B-12 (CYANOCOBALAMIN) 1000 MCG tablet Take 1 tablet (1,000 mcg total) by mouth daily.     No current facility-administered medications on file prior to visit.    Past Medical History:  Diagnosis Date  . Diabetes mellitus without complication (HCC)   . Hypertension     Past Surgical History:  Procedure Laterality Date  .  ABDOMINAL HYSTERECTOMY     fibroids  . CARPAL TUNNEL RELEASE Bilateral   . CHOLECYSTECTOMY      Social History   Socioeconomic History  . Marital status: Widowed    Spouse name: Not on file  . Number of children: Not on file  . Years of education: Not on file  . Highest education level: Not on file  Occupational History  . Not on file  Tobacco Use  . Smoking status: Never Smoker  . Smokeless tobacco: Never Used  Substance and Sexual Activity  . Alcohol use: Yes    Comment: occasional, holidays  . Drug use: No  . Sexual activity: Not on file  Other Topics Concern  . Not on file  Social History Narrative   She walks her dog routinely.    Previously worked as a Tree surgeon.   Right handed.   One level with son.   Caffeine - soda 1 bottle/day   Education - 2 years college   Social Determinants of Corporate investment banker Strain: Not on file  Food Insecurity: Not on file  Transportation Needs: Not on file  Physical Activity: Not on file  Stress: Not on file  Social Connections: Not on file    Family History  Problem Relation Age of Onset  . Diabetes Mother   . Pancreatic cancer Sister   . Hypertension Father     Review of Systems  Constitutional: Negative for chills and  fever.  Respiratory: Negative for cough, shortness of breath and wheezing.   Cardiovascular: Negative for chest pain, palpitations and leg swelling.  Neurological: Negative for light-headedness and headaches.       Objective:   Vitals:   09/18/20 1028  BP: 128/78  Pulse: 96  Temp: 98.5 F (36.9 C)  SpO2: 97%   BP Readings from Last 3 Encounters:  09/18/20 128/78  03/24/20 138/70  01/24/20 122/76   Wt Readings from Last 3 Encounters:  09/18/20 147 lb (66.7 kg)  03/24/20 150 lb 12.8 oz (68.4 kg)  01/24/20 151 lb (68.5 kg)   Body mass index is 30.72 kg/m.   Physical Exam    Constitutional: Appears well-developed and well-nourished. No distress.  HENT:  Head: Normocephalic  and atraumatic.  Neck: Neck supple. No tracheal deviation present. No thyromegaly present.  No cervical lymphadenopathy Cardiovascular: Normal rate, regular rhythm and normal heart sounds.   No murmur heard. No carotid bruit .  No edema Pulmonary/Chest: Effort normal and breath sounds normal. No respiratory distress. No has no wheezes. No rales.  Skin: Skin is warm and dry. Not diaphoretic.  Psychiatric: Normal mood and affect. Behavior is normal.      Assessment & Plan:    See Problem List for Assessment and Plan of chronic medical problems.    This visit occurred during the SARS-CoV-2 public health emergency.  Safety protocols were in place, including screening questions prior to the visit, additional usage of staff PPE, and extensive cleaning of exam room while observing appropriate contact time as indicated for disinfecting solutions.

## 2020-09-17 NOTE — Patient Instructions (Addendum)
    Blood work was ordered.      Medications changes include :   none     Please followup in 6 months  

## 2020-09-18 ENCOUNTER — Ambulatory Visit: Payer: Medicare PPO | Admitting: Internal Medicine

## 2020-09-18 ENCOUNTER — Encounter: Payer: Self-pay | Admitting: Internal Medicine

## 2020-09-18 ENCOUNTER — Other Ambulatory Visit: Payer: Self-pay

## 2020-09-18 VITALS — BP 128/78 | HR 96 | Temp 98.5°F | Ht <= 58 in | Wt 147.0 lb

## 2020-09-18 DIAGNOSIS — E1142 Type 2 diabetes mellitus with diabetic polyneuropathy: Secondary | ICD-10-CM

## 2020-09-18 DIAGNOSIS — E782 Mixed hyperlipidemia: Secondary | ICD-10-CM | POA: Diagnosis not present

## 2020-09-18 DIAGNOSIS — I10 Essential (primary) hypertension: Secondary | ICD-10-CM | POA: Diagnosis not present

## 2020-09-18 DIAGNOSIS — G6289 Other specified polyneuropathies: Secondary | ICD-10-CM

## 2020-09-18 LAB — COMPREHENSIVE METABOLIC PANEL
ALT: 18 U/L (ref 0–35)
AST: 21 U/L (ref 0–37)
Albumin: 4 g/dL (ref 3.5–5.2)
Alkaline Phosphatase: 84 U/L (ref 39–117)
BUN: 20 mg/dL (ref 6–23)
CO2: 30 mEq/L (ref 19–32)
Calcium: 10.5 mg/dL (ref 8.4–10.5)
Chloride: 106 mEq/L (ref 96–112)
Creatinine, Ser: 0.82 mg/dL (ref 0.40–1.20)
GFR: 67.63 mL/min (ref >60.00)
Glucose, Bld: 96 mg/dL (ref 70–99)
Potassium: 3.7 mEq/L (ref 3.5–5.1)
Sodium: 145 mEq/L (ref 135–145)
Total Bilirubin: 0.6 mg/dL (ref 0.2–1.2)
Total Protein: 7.2 g/dL (ref 6.0–8.3)

## 2020-09-18 LAB — LIPID PANEL
Cholesterol: 176 mg/dL (ref 0–200)
HDL: 66 mg/dL (ref >39.00)
LDL Cholesterol: 81 mg/dL (ref 0–99)
NonHDL: 110.33
Total CHOL/HDL Ratio: 3
Triglycerides: 146 mg/dL (ref 0.0–149.0)
VLDL: 29.2 mg/dL (ref 0.0–40.0)

## 2020-09-18 LAB — HEMOGLOBIN A1C: Hgb A1c MFr Bld: 5.4 % (ref 4.6–6.5)

## 2020-09-18 NOTE — Assessment & Plan Note (Signed)
Chronic BP well controlled Continue amlodipine 7.5 mg daily, losartan 50 mg daily cmp  

## 2020-09-18 NOTE — Assessment & Plan Note (Signed)
Chronic Check lipid panel Last LDL less than 100 Lifestyle controlled Regular exercise and healthy diet encouraged

## 2020-09-18 NOTE — Assessment & Plan Note (Signed)
Chronic Lab Results  Component Value Date   HGBA1C 5.6 03/24/2020   Sugars have been well controlled Check A1c today-consider stopping metformin Encouraged low sugar/carbohydrate diet and regular exercise

## 2020-09-18 NOTE — Assessment & Plan Note (Addendum)
Chronic Has pain sometimes Controlled Continue gabapentin 100 mg twice daily

## 2020-09-19 ENCOUNTER — Other Ambulatory Visit: Payer: Self-pay | Admitting: Internal Medicine

## 2020-09-22 ENCOUNTER — Telehealth: Payer: Self-pay | Admitting: Internal Medicine

## 2020-09-22 NOTE — Telephone Encounter (Signed)
Patient called and said that she would like for her recent lab results to be mailed to her. Please advise

## 2020-09-23 NOTE — Telephone Encounter (Signed)
Mailed out to patient today 

## 2020-10-08 ENCOUNTER — Other Ambulatory Visit: Payer: Self-pay | Admitting: Internal Medicine

## 2020-10-08 DIAGNOSIS — Z1231 Encounter for screening mammogram for malignant neoplasm of breast: Secondary | ICD-10-CM

## 2020-10-17 ENCOUNTER — Ambulatory Visit
Admission: EM | Admit: 2020-10-17 | Discharge: 2020-10-17 | Disposition: A | Discharge status: Home / Self Care | Payer: Medicare PPO | Attending: Family Medicine | Admitting: Family Medicine

## 2020-10-17 ENCOUNTER — Other Ambulatory Visit: Payer: Self-pay

## 2020-10-17 DIAGNOSIS — E860 Dehydration: Secondary | ICD-10-CM | POA: Diagnosis not present

## 2020-10-17 DIAGNOSIS — E119 Type 2 diabetes mellitus without complications: Secondary | ICD-10-CM | POA: Diagnosis not present

## 2020-10-17 DIAGNOSIS — Z833 Family history of diabetes mellitus: Secondary | ICD-10-CM | POA: Diagnosis not present

## 2020-10-17 DIAGNOSIS — Z79899 Other long term (current) drug therapy: Secondary | ICD-10-CM | POA: Diagnosis not present

## 2020-10-17 DIAGNOSIS — R42 Dizziness and giddiness: Secondary | ICD-10-CM

## 2020-10-17 DIAGNOSIS — Z880 Allergy status to penicillin: Secondary | ICD-10-CM | POA: Diagnosis not present

## 2020-10-17 DIAGNOSIS — I10 Essential (primary) hypertension: Secondary | ICD-10-CM | POA: Diagnosis not present

## 2020-10-17 LAB — POCT URINALYSIS DIP (MANUAL ENTRY)
Bilirubin, UA: NEGATIVE
Glucose, UA: NEGATIVE mg/dL
Ketones, POC UA: NEGATIVE mg/dL
Nitrite, UA: NEGATIVE
Protein Ur, POC: NEGATIVE mg/dL
Spec Grav, UA: 1.02 (ref 1.010–1.025)
Urobilinogen, UA: 0.2 E.U./dL
pH, UA: 5.5 (ref 5.0–8.0)

## 2020-10-17 LAB — POCT FASTING CBG KUC MANUAL ENTRY: POCT Glucose (KUC): 99 mg/dL (ref 70–99)

## 2020-10-17 NOTE — ED Triage Notes (Addendum)
Patient presents to Urgent Care with complaints of elevated BP and dizziness. She is on BP meds but reports current systolic readings in the 159 range which is above her baseline. She states systolic number normally runs in the 140's with her meds. She also states she was recently seen by her PCP on 04/22 who instructed her to stop her DM med and no longer req to check BG levels.    Denies SOB, chest pain, or changes in vision.

## 2020-10-17 NOTE — ED Provider Notes (Signed)
[facility]    CSN: [id number] Arrival date & time: 10/17/20  1135      History   Chief Complaint Chief Complaint  Patient presents with  . Hypertension    HPI Wendy Rogers is a 80 y.o. female.   HPI Patient with a medical history significant for type 2 diabetes without complication with good control and hypertension presents today as she has had dizziness with an intermittent headache over the last 3 days.  She reports the dizziness occurs mostly in the morning time and resolves as her day goes on.  She denies falling.  She was concerned that her blood pressure may be higher than normal.  Initial reading here today was slightly elevated 151/76 however subsequent readings are less than 170/80.  Patient reports that she drinks plenty of water and has been eating normally no changes at all in diet. She has not had any chest pain, shortness of breath, or changes in vision.  She has had some postural dizziness in the past and was instructed by her PCP to rise slowly from a sitting position.  She also endorses she has been urinating a little bit more frequently recently however denies any pain with urination or any low back pain. Past Medical History:  Diagnosis Date  . Diabetes mellitus without complication (HCC)   . Hypertension     Patient Active Problem List   Diagnosis Date Noted  . Left shoulder pain 01/24/2020  . History of gout 05/01/2019  . Arthralgia 05/01/2019  . Hyperlipidemia 08/15/2018  . Acute right-sided thoracic back pain 02/15/2018  . Chest heaviness 02/15/2018  . Ear discomfort, left 06/22/2017  . Ringing in ear, left 06/22/2017  . Low TSH level 06/22/2017  . Skin abnormalities 05/15/2017  . Diabetic polyneuropathy (HCC) 07/04/2016  . Foot pain, bilateral 07/04/2016  . Low vitamin B12 level 07/04/2016  . Essential hypertension, benign 12/29/2015  . Diabetes (HCC) 12/29/2015  . Dry eyes 12/29/2015    Past Surgical History:  Procedure  Laterality Date  . ABDOMINAL HYSTERECTOMY     fibroids  . CARPAL TUNNEL RELEASE Bilateral   . CHOLECYSTECTOMY      OB History   No obstetric history on file.      Home Medications    Prior to Admission medications   Medication Sig Start Date End Date Taking? Authorizing Provider  amLODipine (NORVASC) 5 MG tablet TAKE 1 AND 1/2 TABLETS(7.5 MG) BY MOUTH DAILY 12/26/19   Burns, Bobette Mo, MD  gabapentin (NEURONTIN) 100 MG capsule TAKE 1 CAPSULE(100 MG) BY MOUTH TWICE DAILY 03/24/20   Pincus Sanes, MD  losartan (COZAAR) 50 MG tablet TAKE 1 TABLET(50 MG) BY MOUTH DAILY 02/19/20   Pincus Sanes, MD  vitamin B-12 (CYANOCOBALAMIN) 1000 MCG tablet Take 1 tablet (1,000 mcg total) by mouth daily. 08/04/16   Pincus Sanes, MD    Family History Family History  Problem Relation Age of Onset  . Diabetes Mother   . Pancreatic cancer Sister   . Hypertension Father     Social History Social History   Tobacco Use  . Smoking status: Never Smoker  . Smokeless tobacco: Never Used  Substance Use Topics  . Alcohol use: Yes    Comment: occasional, holidays  . Drug use: No     Allergies   Penicillins   Review of Systems Review of Systems Pertinent negatives listed in HPI   Physical Exam Triage Vital Signs ED Triage Vitals  Enc Vitals Group  BP 10/17/20 1243 (S) (!) 151/76     Pulse Rate 10/17/20 1243 92     Resp 10/17/20 1243 16     Temp 10/17/20 1243 97.8 F (36.6 C)     Temp Source 10/17/20 1243 Oral     SpO2 10/17/20 1243 96 %     Weight --      Height --      Head Circumference --      Peak Flow --      Pain Score 10/17/20 1239 0     Pain Loc --      Pain Edu? --      Excl. in GC? --    No data found.  Updated Vital Signs BP (S) (!) 151/76 (BP Location: Left Arm) Comment: Pt states she did not take her BP meds this morning.  Pulse 92   Temp 97.8 F (36.6 C) (Oral)   Resp 16   SpO2 96%   Visual Acuity Right Eye Distance:   Left Eye Distance:   Bilateral  Distance:    Right Eye Near:   Left Eye Near:    Bilateral Near:     Physical Exam Constitutional: Patient appears well-developed and well-nourished. No distress. HENT: Normocephalic, atraumatic, External right and left ear normal..  Eyes: Conjunctivae and EOM are normal. PERRLA, no scleral icterus. Neck: Normal ROM. Neck supple. No JVD. No tracheal deviation. No thyromegaly. CVS: RRR, S1/S2 +, no murmurs, no gallops, no carotid bruit.  Pulmonary: Effort and breath sounds normal, no stridor, rhonchi, wheezes, rales.  Abdominal: Soft. BS +, no distension, tenderness, rebound or guarding.  Musculoskeletal: Normal range of motion. No edema and no tenderness.  Neuro: Alert. Normal reflexes, muscle tone coordination.  Facial symmetry present, no cranial nerve deficit. Skin: Skin is warm and dry. No rash noted. Not diaphoretic. No erythema. No pallor. Psychiatric: Normal mood and affect. Behavior, judgment, thought content normal.  UC Treatments / Results  Labs (all labs ordered are listed, but only abnormal results are displayed) Labs Reviewed - No data to display  EKG        Radiology No results found.  Procedures Procedures (including critical care time)  Medications Ordered in UC Medications - No data to display  Initial Impression / Assessment and Plan / UC Course  I have reviewed the triage vital signs and the nursing notes.  Pertinent labs & imaging results that were available during my care of the patient were reviewed by me and considered in my medical decision making (see chart for details).    80 year old African-American female presents today with dizziness and headache intermittently occurring over the period of a week.  Neurological exam today is grossly intact.  ECG compared to prior no acute findings are ST changes.  Patient's pressure is slightly elevated however on recheck and with serial orthostatic measurements blood pressure is well controlled.  After  discussed with patient she reports she has had some similar episodes in the past and her doctor had advised her to slowly change positions to prevent sudden onset dizziness.  Explained to patient that no findings here yielded a reason for dizziness however would encourage water intake to prevent dehydration and did discuss again the importance of sitting on the edge of the bed prior to ambulating.  Strict ER precautions given if symptoms worsen or do not improve.   Final Clinical Impressions(s) / UC Diagnoses   Final diagnoses:  Dizziness  Mild dehydration  Well-controlled hypertension  Discharge Instructions     To reduce dizziness upon awakening or with changes in position sit and dangle your legs before any sudden changes in position to avoid onset of postural related dizziness.  Your urine indicates that you are slightly dehydrated so encourage you to drink more water.  Your urine also had a small amount of bacteria present sending to the lab to rule out infection.  Follow-up with your primary care doctor on Monday if the symptoms persist.  If at any point your symptoms worsen the dizziness becomes more persistent or you develop any visual changes or severe headache go immediately to the nearest emergency department.    ED Prescriptions    None     PDMP not reviewed this encounter.   Bing Neighbors, [place] 10/20/20 2202

## 2020-10-17 NOTE — Discharge Instructions (Signed)
To reduce dizziness upon awakening or with changes in position sit and dangle your legs before any sudden changes in position to avoid onset of postural related dizziness.  Your urine indicates that you are slightly dehydrated so encourage you to drink more water.  Your urine also had a small amount of bacteria present sending to the lab to rule out infection.  Follow-up with your primary care doctor on Monday if the symptoms persist.  If at any point your symptoms worsen the dizziness becomes more persistent or you develop any visual changes or severe headache go immediately to the nearest emergency department.

## 2020-10-18 LAB — URINE CULTURE

## 2020-11-27 DIAGNOSIS — D3131 Benign neoplasm of right choroid: Secondary | ICD-10-CM | POA: Diagnosis not present

## 2020-11-27 DIAGNOSIS — H26493 Other secondary cataract, bilateral: Secondary | ICD-10-CM | POA: Diagnosis not present

## 2020-11-27 DIAGNOSIS — H04123 Dry eye syndrome of bilateral lacrimal glands: Secondary | ICD-10-CM | POA: Diagnosis not present

## 2020-11-27 DIAGNOSIS — Z961 Presence of intraocular lens: Secondary | ICD-10-CM | POA: Diagnosis not present

## 2020-11-27 DIAGNOSIS — E119 Type 2 diabetes mellitus without complications: Secondary | ICD-10-CM | POA: Diagnosis not present

## 2020-11-27 LAB — HM DIABETES EYE EXAM

## 2020-12-04 ENCOUNTER — Other Ambulatory Visit: Payer: Self-pay

## 2020-12-04 ENCOUNTER — Ambulatory Visit
Admission: RE | Admit: 2020-12-04 | Discharge: 2020-12-04 | Disposition: A | Discharge status: Home / Self Care | Payer: Medicare PPO | Source: Ambulatory Visit | Attending: Internal Medicine | Admitting: Internal Medicine

## 2020-12-04 DIAGNOSIS — Z1231 Encounter for screening mammogram for malignant neoplasm of breast: Secondary | ICD-10-CM | POA: Diagnosis not present

## 2020-12-10 ENCOUNTER — Encounter: Payer: Self-pay | Admitting: Internal Medicine

## 2020-12-10 NOTE — Progress Notes (Unsigned)
Outside notes received. Information abstracted. Notes sent to scan.  

## 2020-12-22 ENCOUNTER — Ambulatory Visit: Payer: Medicare PPO

## 2021-03-18 ENCOUNTER — Encounter: Payer: Self-pay | Admitting: Internal Medicine

## 2021-03-18 NOTE — Patient Instructions (Addendum)
Blood work was ordered.      Medications changes include :   none     Please followup in 6 months     Health Maintenance, Female Adopting a healthy lifestyle and getting preventive care are important in promoting health and wellness. Ask your health care provider about: The right schedule for you to have regular tests and exams. Things you can do on your own to prevent diseases and keep yourself healthy. What should I know about diet, weight, and exercise? Eat a healthy diet  Eat a diet that includes plenty of vegetables, fruits, low-fat dairy products, and lean protein. Do not eat a lot of foods that are high in solid fats, added sugars, or sodium. Maintain a healthy weight Body mass index (BMI) is used to identify weight problems. It estimates body fat based on height and weight. Your health care provider can help determine your BMI and help you achieve or maintain a healthy weight. Get regular exercise Get regular exercise. This is one of the most important things you can do for your health. Most adults should: Exercise for at least 150 minutes each week. The exercise should increase your heart rate and make you sweat (moderate-intensity exercise). Do strengthening exercises at least twice a week. This is in addition to the moderate-intensity exercise. Spend less time sitting. Even light physical activity can be beneficial. Watch cholesterol and blood lipids Have your blood tested for lipids and cholesterol at 80 years of age, then have this test every 5 years. Have your cholesterol levels checked more often if: Your lipid or cholesterol levels are high. You are older than 80 years of age. You are at high risk for heart disease. What should I know about cancer screening? Depending on your health history and family history, you may need to have cancer screening at various ages. This may include screening for: Breast cancer. Cervical cancer. Colorectal cancer. Skin  cancer. Lung cancer. What should I know about heart disease, diabetes, and high blood pressure? Blood pressure and heart disease High blood pressure causes heart disease and increases the risk of stroke. This is more likely to develop in people who have high blood pressure readings, are of African descent, or are overweight. Have your blood pressure checked: Every 3-5 years if you are 18-39 years of age. Every year if you are 40 years old or older. Diabetes Have regular diabetes screenings. This checks your fasting blood sugar level. Have the screening done: Once every three years after age 40 if you are at a normal weight and have a low risk for diabetes. More often and at a younger age if you are overweight or have a high risk for diabetes. What should I know about preventing infection? Hepatitis B If you have a higher risk for hepatitis B, you should be screened for this virus. Talk with your health care provider to find out if you are at risk for hepatitis B infection. Hepatitis C Testing is recommended for: Everyone born from 1945 through 1965. Anyone with known risk factors for hepatitis C. Sexually transmitted infections (STIs) Get screened for STIs, including gonorrhea and chlamydia, if: You are sexually active and are younger than 80 years of age. You are older than 80 years of age and your health care provider tells you that you are at risk for this type of infection. Your sexual activity has changed since you were last screened, and you are at increased risk for chlamydia or gonorrhea. Ask your health care   provider if you are at risk. Ask your health care provider about whether you are at high risk for HIV. Your health care provider may recommend a prescription medicine to help prevent HIV infection. If you choose to take medicine to prevent HIV, you should first get tested for HIV. You should then be tested every 3 months for as long as you are taking the medicine. Pregnancy If  you are about to stop having your period (premenopausal) and you may become pregnant, seek counseling before you get pregnant. Take 400 to 800 micrograms (mcg) of folic acid every day if you become pregnant. Ask for birth control (contraception) if you want to prevent pregnancy. Osteoporosis and menopause Osteoporosis is a disease in which the bones lose minerals and strength with aging. This can result in bone fractures. If you are 65 years old or older, or if you are at risk for osteoporosis and fractures, ask your health care provider if you should: Be screened for bone loss. Take a calcium or vitamin D supplement to lower your risk of fractures. Be given hormone replacement therapy (HRT) to treat symptoms of menopause. Follow these instructions at home: Lifestyle Do not use any products that contain nicotine or tobacco, such as cigarettes, e-cigarettes, and chewing tobacco. If you need help quitting, ask your health care provider. Do not use street drugs. Do not share needles. Ask your health care provider for help if you need support or information about quitting drugs. Alcohol use Do not drink alcohol if: Your health care provider tells you not to drink. You are pregnant, may be pregnant, or are planning to become pregnant. If you drink alcohol: Limit how much you use to 0-1 drink a day. Limit intake if you are breastfeeding. Be aware of how much alcohol is in your drink. In the [place] one drink equals one 12 oz bottle of beer (355 mL), one 5 oz glass of wine (148 mL), or one 1 oz glass of hard liquor (44 mL). General instructions Schedule regular health, dental, and eye exams. Stay current with your vaccines. Tell your health care provider if: You often feel depressed. You have ever been abused or do not feel safe at home. Summary Adopting a healthy lifestyle and getting preventive care are important in promoting health and wellness. Follow your health care provider's  instructions about healthy diet, exercising, and getting tested or screened for diseases. Follow your health care provider's instructions on monitoring your cholesterol and blood pressure. This information is not intended to replace advice given to you by your health care provider. Make sure you discuss any questions you have with your health care provider. Document Revised: 07/24/2020 Document Reviewed: 05/09/2018 Elsevier Patient Education  2022 Elsevier Inc.  

## 2021-03-18 NOTE — Progress Notes (Signed)
Subjective:    Patient ID: Wendy Rogers, female    DOB: 1941-05-19, 80 y.o.   MRN: [id number]   This visit occurred during the SARS-CoV-2 public health emergency.  Safety protocols were in place, including screening questions prior to the visit, additional usage of staff PPE, and extensive cleaning of exam room while observing appropriate contact time as indicated for disinfecting solutions.    HPI She is here for a physical exam.    She denies any change in her health.  She has no concerns.     Medications and allergies reviewed with patient and updated if appropriate.  Patient Active Problem List   Diagnosis Date Noted   Left shoulder pain 01/24/2020   History of gout 05/01/2019   Arthralgia 05/01/2019   Hyperlipidemia 08/15/2018   Ringing in ear, left 06/22/2017   Low TSH level 06/22/2017   Skin abnormalities 05/15/2017   Diabetic polyneuropathy (HCC) 07/04/2016   Foot pain, bilateral 07/04/2016   Low vitamin B12 level 07/04/2016   Essential hypertension, benign 12/29/2015   Diabetes (HCC) 12/29/2015   Dry eyes 12/29/2015    Current Outpatient Medications on File Prior to Visit  Medication Sig Dispense Refill   amLODipine (NORVASC) 5 MG tablet TAKE 1 AND 1/2 TABLETS(7.5 MG) BY MOUTH DAILY 135 tablet 1   gabapentin (NEURONTIN) 100 MG capsule TAKE 1 CAPSULE(100 MG) BY MOUTH TWICE DAILY 180 capsule 1   losartan (COZAAR) 50 MG tablet TAKE 1 TABLET(50 MG) BY MOUTH DAILY 90 tablet 1   vitamin B-12 (CYANOCOBALAMIN) 1000 MCG tablet Take 1 tablet (1,000 mcg total) by mouth daily.     No current facility-administered medications on file prior to visit.    Past Medical History:  Diagnosis Date   Diabetes mellitus without complication (HCC)    Hypertension     Past Surgical History:  Procedure Laterality Date   ABDOMINAL HYSTERECTOMY     fibroids   CARPAL TUNNEL RELEASE Bilateral    CHOLECYSTECTOMY      Social History   Socioeconomic History   Marital  status: Widowed    Spouse name: Not on file   Number of children: Not on file   Years of education: Not on file   Highest education level: Not on file  Occupational History   Not on file  Tobacco Use   Smoking status: Never   Smokeless tobacco: Never  Substance and Sexual Activity   Alcohol use: Yes    Comment: occasional, holidays   Drug use: No   Sexual activity: Not on file  Other Topics Concern   Not on file  Social History Narrative   She walks her dog routinely.    Previously worked as a Tree surgeon.   Right handed.   One level with son.   Caffeine - soda 1 bottle/day   Education - 2 years college   Social Determinants of Corporate investment banker Strain: Not on file  Food Insecurity: Not on file  Transportation Needs: Not on file  Physical Activity: Not on file  Stress: Not on file  Social Connections: Not on file    Family History  Problem Relation Age of Onset   Diabetes Mother    Pancreatic cancer Sister    Hypertension Father     Review of Systems  Constitutional:  Negative for chills and fever.  Eyes:  Negative for visual disturbance.  Respiratory:  Negative for cough, shortness of breath and wheezing.   Cardiovascular:  Negative for chest  pain, palpitations and leg swelling.  Gastrointestinal:  Negative for abdominal pain, blood in stool, constipation, diarrhea and nausea.       No gerd  Genitourinary:  Negative for dysuria.  Musculoskeletal:  Negative for arthralgias and back pain.  Skin:  Negative for color change and rash.  Neurological:  Negative for light-headedness and headaches.  Psychiatric/Behavioral:  Negative for dysphoric mood and sleep disturbance. The patient is not nervous/anxious.       Objective:   Vitals:   03/19/21 0931  BP: 140/80  Pulse: 88  Temp: 98 F (36.7 C)  SpO2: 99%   Filed Weights   03/19/21 0931  Weight: 148 lb (67.1 kg)   Body mass index is 30.93 kg/m.  BP Readings from Last 3 Encounters:  03/19/21  140/80  10/17/20 (S) (!) 151/76  09/18/20 128/78    Wt Readings from Last 3 Encounters:  03/19/21 148 lb (67.1 kg)  09/18/20 147 lb (66.7 kg)  03/24/20 150 lb 12.8 oz (68.4 kg)     Physical Exam Constitutional: She appears well-developed and well-nourished. No distress.  HENT:  Head: Normocephalic and atraumatic.  Right Ear: External ear normal. Normal ear canal and TM Left Ear: External ear normal.  Normal ear canal and TM Mouth/Throat: Oropharynx is clear and moist.  Eyes: Conjunctivae and EOM are normal.  Neck: Neck supple. No tracheal deviation present. No thyromegaly present.  No carotid bruit  Cardiovascular: Normal rate, regular rhythm and normal heart sounds.   No murmur heard.  No edema. Pulmonary/Chest: Effort normal and breath sounds normal. No respiratory distress. She has no wheezes. She has no rales.  Breast: deferred   Abdominal: Soft. She exhibits no distension. There is no tenderness.  Lymphadenopathy: She has no cervical adenopathy.  Skin: Skin is warm and dry. She is not diaphoretic.  Psychiatric: She has a normal mood and affect. Her behavior is normal.     Lab Results  Component Value Date   WBC 4.2 03/24/2020   HGB 11.5 (L) 03/24/2020   HCT 35.1 (L) 03/24/2020   PLT 243.0 03/24/2020   GLUCOSE 96 09/18/2020   CHOL 176 09/18/2020   TRIG 146.0 09/18/2020   HDL 66.00 09/18/2020   LDLDIRECT 94.0 03/24/2020   LDLCALC 81 09/18/2020   ALT 18 09/18/2020   AST 21 09/18/2020   NA 145 09/18/2020   K 3.7 09/18/2020   CL 106 09/18/2020   CREATININE 0.82 09/18/2020   BUN 20 09/18/2020   CO2 30 09/18/2020   TSH 0.35 03/24/2020   HGBA1C 5.4 09/18/2020   MICROALBUR 2.0 (H) 12/29/2015         Assessment & Plan:   Physical exam: Screening blood work  ordered Exercise  walking dog Weight  good for age Substance abuse  none   Reviewed recommended immunizations.   Health Maintenance  Topic Date Due   FOOT EXAM  04/14/2020   DEXA SCAN   03/24/2021 (Originally 06/10/2005)   TETANUS/TDAP  03/24/2021 (Originally 06/11/1959)   COVID-19 Vaccine (4 - Booster for Pfizer series) 04/04/2021 (Originally 04/30/2020)   Zoster Vaccines- Shingrix (1 of 2) 06/19/2021 (Originally 06/10/1990)   Pneumonia Vaccine 52+ Years old (1 - PCV) 07/28/2021 (Originally 06/10/1946)   INFLUENZA VACCINE  08/27/2021 (Originally 12/28/2020)   HEMOGLOBIN A1C  03/20/2021   OPHTHALMOLOGY EXAM  11/27/2021   HPV VACCINES  Aged Out          See Problem List for Assessment and Plan of chronic medical problems.

## 2021-03-19 ENCOUNTER — Ambulatory Visit (INDEPENDENT_AMBULATORY_CARE_PROVIDER_SITE_OTHER): Payer: Medicare PPO | Admitting: Internal Medicine

## 2021-03-19 ENCOUNTER — Other Ambulatory Visit: Payer: Self-pay

## 2021-03-19 VITALS — BP 140/80 | HR 88 | Temp 98.0°F | Ht <= 58 in | Wt 148.0 lb

## 2021-03-19 DIAGNOSIS — E782 Mixed hyperlipidemia: Secondary | ICD-10-CM | POA: Diagnosis not present

## 2021-03-19 DIAGNOSIS — E538 Deficiency of other specified B group vitamins: Secondary | ICD-10-CM

## 2021-03-19 DIAGNOSIS — Z0000 Encounter for general adult medical examination without abnormal findings: Secondary | ICD-10-CM

## 2021-03-19 DIAGNOSIS — E1142 Type 2 diabetes mellitus with diabetic polyneuropathy: Secondary | ICD-10-CM

## 2021-03-19 DIAGNOSIS — R7989 Other specified abnormal findings of blood chemistry: Secondary | ICD-10-CM | POA: Diagnosis not present

## 2021-03-19 DIAGNOSIS — I10 Essential (primary) hypertension: Secondary | ICD-10-CM

## 2021-03-19 LAB — LIPID PANEL
Cholesterol: 200 mg/dL (ref 0–200)
HDL: 61 mg/dL (ref >39.00)
NonHDL: 139.18
Total CHOL/HDL Ratio: 3
Triglycerides: 206 mg/dL — ABNORMAL HIGH (ref 0.0–149.0)
VLDL: 41.2 mg/dL — ABNORMAL HIGH (ref 0.0–40.0)

## 2021-03-19 LAB — COMPREHENSIVE METABOLIC PANEL
ALT: 22 U/L (ref 0–35)
AST: 31 U/L (ref 0–37)
Albumin: 4.1 g/dL (ref 3.5–5.2)
Alkaline Phosphatase: 78 U/L (ref 39–117)
BUN: 19 mg/dL (ref 6–23)
CO2: 29 mEq/L (ref 19–32)
Calcium: 10.6 mg/dL — ABNORMAL HIGH (ref 8.4–10.5)
Chloride: 108 mEq/L (ref 96–112)
Creatinine, Ser: 0.74 mg/dL (ref 0.40–1.20)
GFR: 76.22 mL/min (ref >60.00)
Glucose, Bld: 134 mg/dL — ABNORMAL HIGH (ref 70–99)
Potassium: 3.4 mEq/L — ABNORMAL LOW (ref 3.5–5.1)
Sodium: 145 mEq/L (ref 135–145)
Total Bilirubin: 0.6 mg/dL (ref 0.2–1.2)
Total Protein: 7.2 g/dL (ref 6.0–8.3)

## 2021-03-19 LAB — CBC WITH DIFFERENTIAL/PLATELET
Basophils Absolute: 0 10*3/uL (ref 0.0–0.1)
Basophils Relative: 0.8 % (ref 0.0–3.0)
Eosinophils Absolute: 0 10*3/uL (ref 0.0–0.7)
Eosinophils Relative: 0.8 % (ref 0.0–5.0)
HCT: 35.6 % — ABNORMAL LOW (ref 36.0–46.0)
Hemoglobin: 11.6 g/dL — ABNORMAL LOW (ref 12.0–15.0)
Lymphocytes Relative: 40.3 % (ref 12.0–46.0)
Lymphs Abs: 1.7 10*3/uL (ref 0.7–4.0)
MCHC: 32.5 g/dL (ref 30.0–36.0)
MCV: 89.8 fl (ref 78.0–100.0)
Monocytes Absolute: 0.4 10*3/uL (ref 0.1–1.0)
Monocytes Relative: 10.5 % (ref 3.0–12.0)
Neutro Abs: 2 10*3/uL (ref 1.4–7.7)
Neutrophils Relative %: 47.6 % (ref 43.0–77.0)
Platelets: 243 10*3/uL (ref 150.0–400.0)
RBC: 3.97 Mil/uL (ref 3.87–5.11)
RDW: 14.9 % (ref 11.5–15.5)
WBC: 4.1 10*3/uL (ref 4.0–10.5)

## 2021-03-19 LAB — TSH: TSH: 0.37 u[IU]/mL (ref 0.35–5.50)

## 2021-03-19 LAB — HEMOGLOBIN A1C: Hgb A1c MFr Bld: 5.4 % (ref 4.6–6.5)

## 2021-03-19 LAB — VITAMIN B12: Vitamin B-12: 1245 pg/mL — ABNORMAL HIGH (ref 211–911)

## 2021-03-19 LAB — LDL CHOLESTEROL, DIRECT: Direct LDL: 116 mg/dL

## 2021-03-19 NOTE — Assessment & Plan Note (Signed)
Chronic Blood pressure well controlled CMP Continue amlodipine 7.5 mg daily, losartan 50 mg daily

## 2021-03-19 NOTE — Assessment & Plan Note (Signed)
Chronic Intermittent pain Controlled Continue gabapentin twice daily

## 2021-03-19 NOTE — Assessment & Plan Note (Signed)
History of low TSH Recheck TSH Euthyroid on exam and by history

## 2021-03-19 NOTE — Assessment & Plan Note (Signed)
Chronic Regular exercise and healthy diet encouraged Check lipid panel  Continue lifestyle control 

## 2021-03-19 NOTE — Assessment & Plan Note (Addendum)
Chronic Lab Results  Component Value Date   HGBA1C 5.4 09/18/2020   Diet controlled - metformin stopped 6 months ago after last labs Check A1c today Diabetic diet, regular exercise

## 2021-03-19 NOTE — Assessment & Plan Note (Signed)
Chronic Taking vitamin B12 daily Check B12 level 

## 2021-03-21 ENCOUNTER — Encounter: Payer: Self-pay | Admitting: Internal Medicine

## 2021-03-31 ENCOUNTER — Other Ambulatory Visit: Payer: Self-pay | Admitting: Internal Medicine

## 2021-04-01 ENCOUNTER — Other Ambulatory Visit: Payer: Self-pay

## 2021-04-01 ENCOUNTER — Telehealth: Payer: Self-pay | Admitting: Internal Medicine

## 2021-04-01 MED ORDER — LOSARTAN POTASSIUM 50 MG PO TABS: ORAL_TABLET | ORAL | 1 refills | Status: DC

## 2021-04-01 MED ORDER — AMLODIPINE BESYLATE 5 MG PO TABS: ORAL_TABLET | ORAL | 1 refills | Status: DC

## 2021-04-01 MED ORDER — GABAPENTIN 100 MG PO CAPS: ORAL_CAPSULE | ORAL | 2 refills | Status: DC

## 2021-04-01 NOTE — Telephone Encounter (Signed)
1.Medication Requested: amLODipine, gabapentin, losartan   2. Pharmacy (Name, Street, Angels): [facility] [place] [address]  3. On Med List: yes  4. Last Visit with PCP: 10.21.22  5. Next visit date with PCP: 04.24.2023   Agent: Please be advised that RX refills may take up to 3 business days. We ask that you follow-up with your pharmacy.

## 2021-04-01 NOTE — Telephone Encounter (Signed)
Sent in today 

## 2021-09-20 ENCOUNTER — Ambulatory Visit: Payer: Medicare PPO | Admitting: Internal Medicine

## 2021-09-24 ENCOUNTER — Ambulatory Visit: Payer: Medicare PPO | Admitting: Internal Medicine

## 2021-10-07 NOTE — Progress Notes (Signed)
? ? ? ? ?Subjective:  ? ? Patient ID: Wendy Rogers, female    DOB: Jun 28, 1940, 81 y.o.   MRN: [id number] ? ? ? ? ?HPI ?Wendy Rogers is here for follow up of her chronic medical problems, including DM, htn, hld, neuropathy ? ?Feels wonderful.  No concerns.   ? ? ? ?Medications and allergies reviewed with patient and updated if appropriate. ? ?Current Outpatient Medications on File Prior to Visit  ?Medication Sig Dispense Refill  ? amLODipine (NORVASC) 5 MG tablet TAKE 1 AND 1/2 TABLETS(7.5 MG) BY MOUTH DAILY 135 tablet 1  ? gabapentin (NEURONTIN) 100 MG capsule TAKE 1 CAPSULE(100 MG) BY MOUTH TWICE DAILY 60 capsule 2  ? losartan (COZAAR) 50 MG tablet TAKE 1 TABLET(50 MG) BY MOUTH DAILY 90 tablet 1  ? vitamin B-12 (CYANOCOBALAMIN) 1000 MCG tablet Take 1 tablet (1,000 mcg total) by mouth daily.    ? ?No current facility-administered medications on file prior to visit.  ? ? ? ?Review of Systems  ?Constitutional:  Negative for chills and fever.  ?Respiratory:  Negative for cough, shortness of breath and wheezing.   ?Cardiovascular:  Negative for chest pain, palpitations and leg swelling.  ?Neurological:  Negative for light-headedness and headaches.  ? ?   ?Objective:  ? ?Vitals:  ? 10/08/21 1440  ?BP: 136/60  ?Pulse: 86  ?Temp: 98.2 ?F (36.8 ?C)  ?SpO2: 99%  ? ?BP Readings from Last 3 Encounters:  ?10/08/21 136/60  ?03/19/21 140/80  ?10/17/20 (S) (!) 151/76  ? ?Wt Readings from Last 3 Encounters:  ?10/08/21 144 lb (65.3 kg)  ?03/19/21 148 lb (67.1 kg)  ?09/18/20 147 lb (66.7 kg)  ? ?Body mass index is 30.1 kg/m?. ? ?  ?Physical Exam ?Constitutional:   ?   General: She is not in acute distress. ?   Appearance: Normal appearance.  ?HENT:  ?   Head: Normocephalic and atraumatic.  ?Eyes:  ?   Conjunctiva/sclera: Conjunctivae normal.  ?Cardiovascular:  ?   Rate and Rhythm: Normal rate and regular rhythm.  ?   Heart sounds: Normal heart sounds. No murmur heard. ?Pulmonary:  ?   Effort: Pulmonary effort is normal. No  respiratory distress.  ?   Breath sounds: Normal breath sounds. No wheezing.  ?Musculoskeletal:  ?   Cervical back: Neck supple.  ?   Right lower leg: No edema.  ?   Left lower leg: No edema.  ?Lymphadenopathy:  ?   Cervical: No cervical adenopathy.  ?Skin: ?   General: Skin is warm and dry.  ?   Findings: No rash.  ?Neurological:  ?   Mental Status: She is alert. Mental status is at baseline.  ?Psychiatric:     ?   Mood and Affect: Mood normal.     ?   Behavior: Behavior normal.  ? ?   ? ?Lab Results  ?Component Value Date  ? WBC 4.1 03/19/2021  ? HGB 11.6 (L) 03/19/2021  ? HCT 35.6 (L) 03/19/2021  ? PLT 243.0 03/19/2021  ? GLUCOSE 134 (H) 03/19/2021  ? CHOL 200 03/19/2021  ? TRIG 206.0 (H) 03/19/2021  ? HDL 61.00 03/19/2021  ? LDLDIRECT 116.0 03/19/2021  ? LDLCALC 81 09/18/2020  ? ALT 22 03/19/2021  ? AST 31 03/19/2021  ? NA 145 03/19/2021  ? K 3.4 (L) 03/19/2021  ? CL 108 03/19/2021  ? CREATININE 0.74 03/19/2021  ? BUN 19 03/19/2021  ? CO2 29 03/19/2021  ? TSH 0.37 03/19/2021  ? HGBA1C 5.4 03/19/2021  ?  MICROALBUR 2.0 (H) 12/29/2015  ? ? ? ?Assessment & Plan:  ? ? ?See Problem List for Assessment and Plan of chronic medical problems.  ? ? ?

## 2021-10-07 NOTE — Patient Instructions (Addendum)
? ? ? ?  Blood work was ordered.   ? ? ?Medications changes include :   none ? ? ? ? ? ?Return in about 6 months (around 04/10/2022) for follow up. ? ?

## 2021-10-08 ENCOUNTER — Encounter: Payer: Self-pay | Admitting: Internal Medicine

## 2021-10-08 ENCOUNTER — Ambulatory Visit (INDEPENDENT_AMBULATORY_CARE_PROVIDER_SITE_OTHER): Payer: Medicare PPO | Admitting: Internal Medicine

## 2021-10-08 VITALS — BP 136/60 | HR 86 | Temp 98.2°F | Wt 144.0 lb

## 2021-10-08 DIAGNOSIS — E782 Mixed hyperlipidemia: Secondary | ICD-10-CM | POA: Diagnosis not present

## 2021-10-08 DIAGNOSIS — I10 Essential (primary) hypertension: Secondary | ICD-10-CM | POA: Diagnosis not present

## 2021-10-08 DIAGNOSIS — E1142 Type 2 diabetes mellitus with diabetic polyneuropathy: Secondary | ICD-10-CM

## 2021-10-08 LAB — COMPREHENSIVE METABOLIC PANEL
ALT: 20 U/L (ref 0–35)
AST: 23 U/L (ref 0–37)
Albumin: 4.2 g/dL (ref 3.5–5.2)
Alkaline Phosphatase: 84 U/L (ref 39–117)
BUN: 23 mg/dL (ref 6–23)
CO2: 26 mEq/L (ref 19–32)
Calcium: 10.3 mg/dL (ref 8.4–10.5)
Chloride: 106 mEq/L (ref 96–112)
Creatinine, Ser: 0.84 mg/dL (ref 0.40–1.20)
GFR: 65.21 mL/min (ref >60.00)
Glucose, Bld: 92 mg/dL (ref 70–99)
Potassium: 3.7 mEq/L (ref 3.5–5.1)
Sodium: 141 mEq/L (ref 135–145)
Total Bilirubin: 0.5 mg/dL (ref 0.2–1.2)
Total Protein: 7.2 g/dL (ref 6.0–8.3)

## 2021-10-08 LAB — CBC WITH DIFFERENTIAL/PLATELET
Basophils Absolute: 0 10*3/uL (ref 0.0–0.1)
Basophils Relative: 0.5 % (ref 0.0–3.0)
Eosinophils Absolute: 0 10*3/uL (ref 0.0–0.7)
Eosinophils Relative: 0.8 % (ref 0.0–5.0)
HCT: 34.6 % — ABNORMAL LOW (ref 36.0–46.0)
Hemoglobin: 11.4 g/dL — ABNORMAL LOW (ref 12.0–15.0)
Lymphocytes Relative: 45.2 % (ref 12.0–46.0)
Lymphs Abs: 2.3 10*3/uL (ref 0.7–4.0)
MCHC: 33.1 g/dL (ref 30.0–36.0)
MCV: 90 fl (ref 78.0–100.0)
Monocytes Absolute: 0.4 10*3/uL (ref 0.1–1.0)
Monocytes Relative: 7.9 % (ref 3.0–12.0)
Neutro Abs: 2.3 10*3/uL (ref 1.4–7.7)
Neutrophils Relative %: 45.6 % (ref 43.0–77.0)
Platelets: 213 10*3/uL (ref 150.0–400.0)
RBC: 3.84 Mil/uL — ABNORMAL LOW (ref 3.87–5.11)
RDW: 14.6 % (ref 11.5–15.5)
WBC: 5 10*3/uL (ref 4.0–10.5)

## 2021-10-08 LAB — LIPID PANEL
Cholesterol: 197 mg/dL (ref 0–200)
HDL: 67.2 mg/dL (ref >39.00)
LDL Cholesterol: 97 mg/dL (ref 0–99)
NonHDL: 129.41
Total CHOL/HDL Ratio: 3
Triglycerides: 161 mg/dL — ABNORMAL HIGH (ref 0.0–149.0)
VLDL: 32.2 mg/dL (ref 0.0–40.0)

## 2021-10-08 LAB — HEMOGLOBIN A1C: Hgb A1c MFr Bld: 5.3 % (ref 4.6–6.5)

## 2021-10-08 NOTE — Assessment & Plan Note (Signed)
Chronic Blood pressure well controlled CMP Continue amlodipine 7.5 mg daily, losartan 50 mg daily 

## 2021-10-08 NOTE — Assessment & Plan Note (Addendum)
Chronic ?Regular exercise and healthy diet encouraged ?Check lipid panel ?Discussed increased risk for heart disease and stroke and recommended statin ?Refused statin ?Continue lifestyle control ?

## 2021-10-08 NOTE — Assessment & Plan Note (Signed)
Chronic ?Lab Results  ?Component Value Date  ? HGBA1C 5.4 03/19/2021  ? ?Sugars well controlled ?Check A1c, urine microalbumin today ?Continue lifestyle control ?Stressed regular exercise, diabetic diet ? ? ?

## 2021-10-08 NOTE — Assessment & Plan Note (Addendum)
Chronic ?Controlled with current dose of gabapentin ?Continue gabapentin 100 mg twice daily ?

## 2021-10-29 ENCOUNTER — Other Ambulatory Visit: Payer: Self-pay | Admitting: Internal Medicine

## 2021-10-29 DIAGNOSIS — Z1231 Encounter for screening mammogram for malignant neoplasm of breast: Secondary | ICD-10-CM

## 2021-12-07 ENCOUNTER — Ambulatory Visit: Payer: Medicare PPO

## 2021-12-07 ENCOUNTER — Ambulatory Visit
Admission: RE | Admit: 2021-12-07 | Discharge: 2021-12-07 | Disposition: A | Discharge status: Home / Self Care | Payer: Medicare PPO | Source: Ambulatory Visit | Attending: Internal Medicine | Admitting: Internal Medicine

## 2021-12-07 DIAGNOSIS — Z1231 Encounter for screening mammogram for malignant neoplasm of breast: Secondary | ICD-10-CM | POA: Diagnosis not present

## 2021-12-15 ENCOUNTER — Telehealth: Payer: Self-pay | Admitting: Internal Medicine

## 2021-12-15 NOTE — Telephone Encounter (Signed)
Left message for patient to call back to schedule Medicare Annual Wellness Visit   Last AWV  05/15/17  Please schedule at anytime with [facility] Advisor if patient calls the office back.      Any questions, please call me at [phone]

## 2022-01-05 IMAGING — MG MM DIGITAL SCREENING BILAT W/ TOMO AND CAD
6 of 12 series · 6 of 36 positions shown · non-contrast
Comparison: Previous exam(s).

CLINICAL DATA: Screening.

EXAM:
DIGITAL SCREENING BILATERAL MAMMOGRAM WITH TOMOSYNTHESIS AND CAD
TECHNIQUE: Bilateral screening digital craniocaudal and mediolateral oblique
mammograms were obtained. Bilateral screening digital breast
tomosynthesis was performed. The images were evaluated with
computer-aided detection.

[L CC synth-2D (1 of 2)]
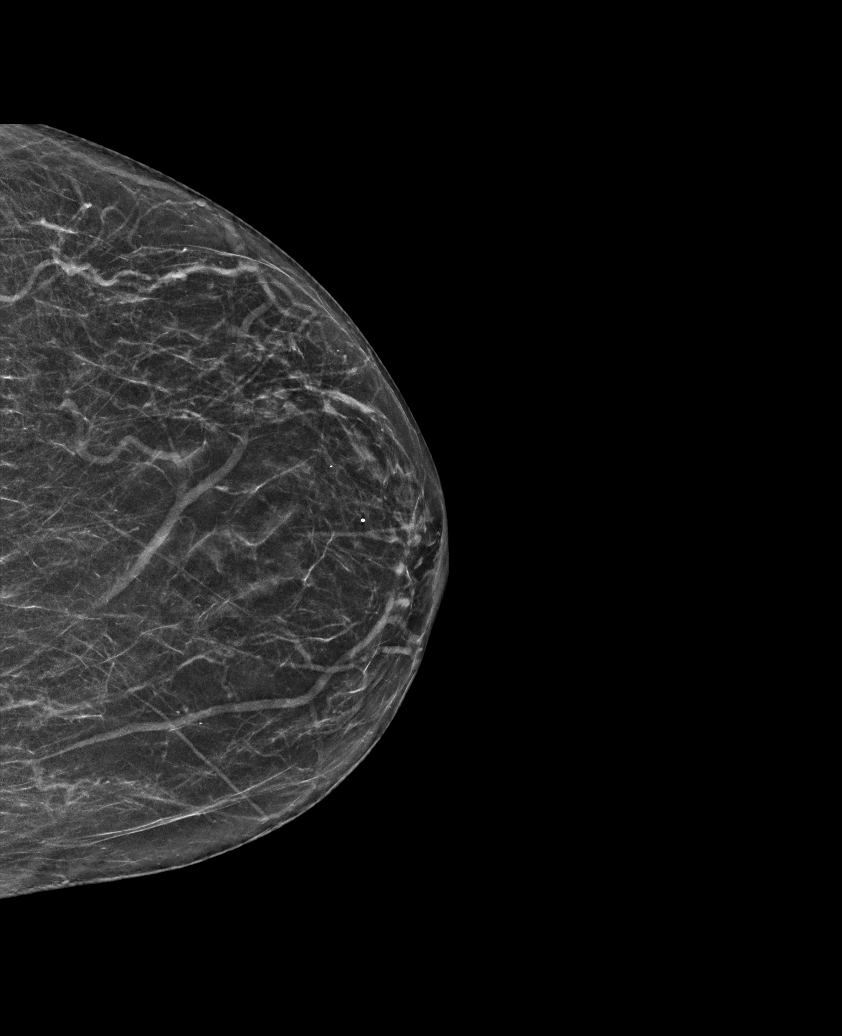

[R MLO synth-2D (1 of 2)]
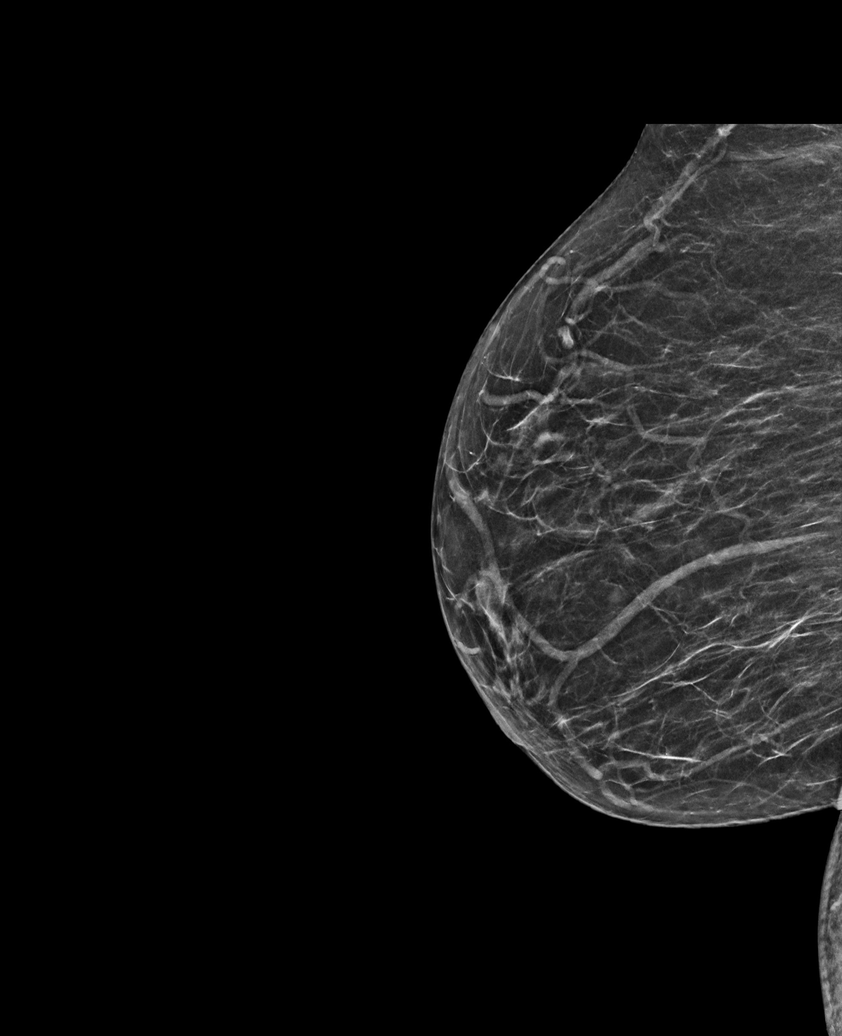

[L MLO synth-2D]
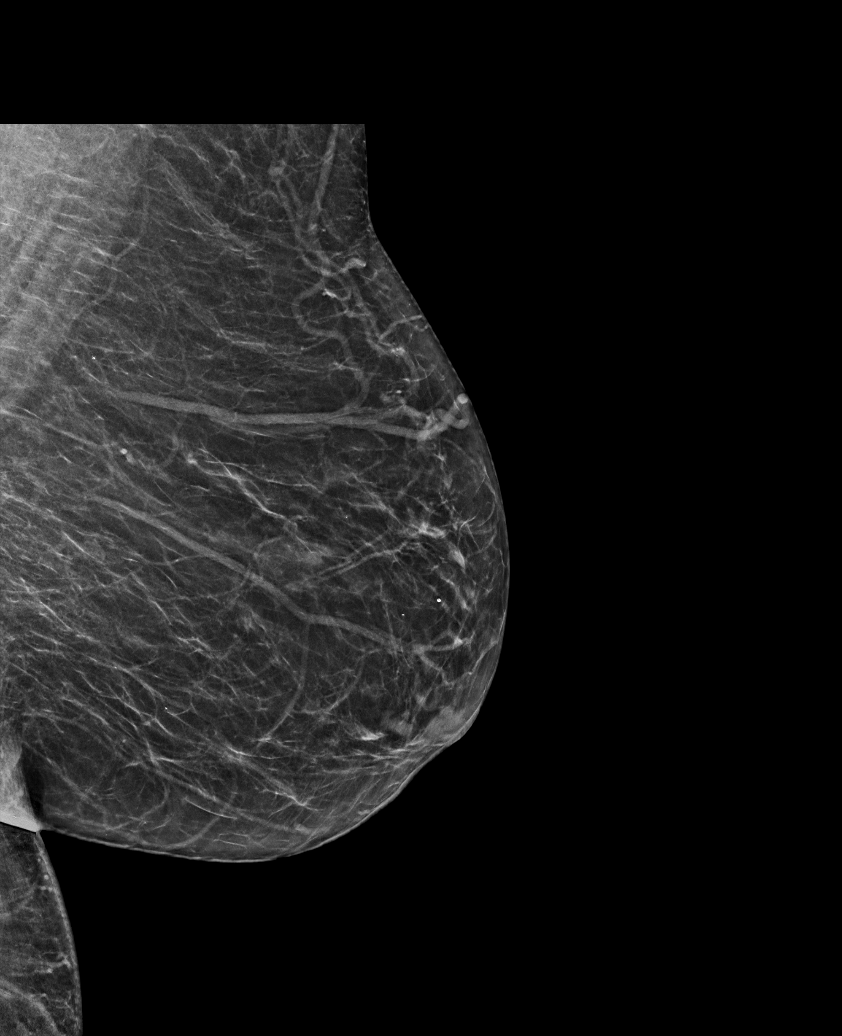

[L CC synth-2D (2 of 2)]
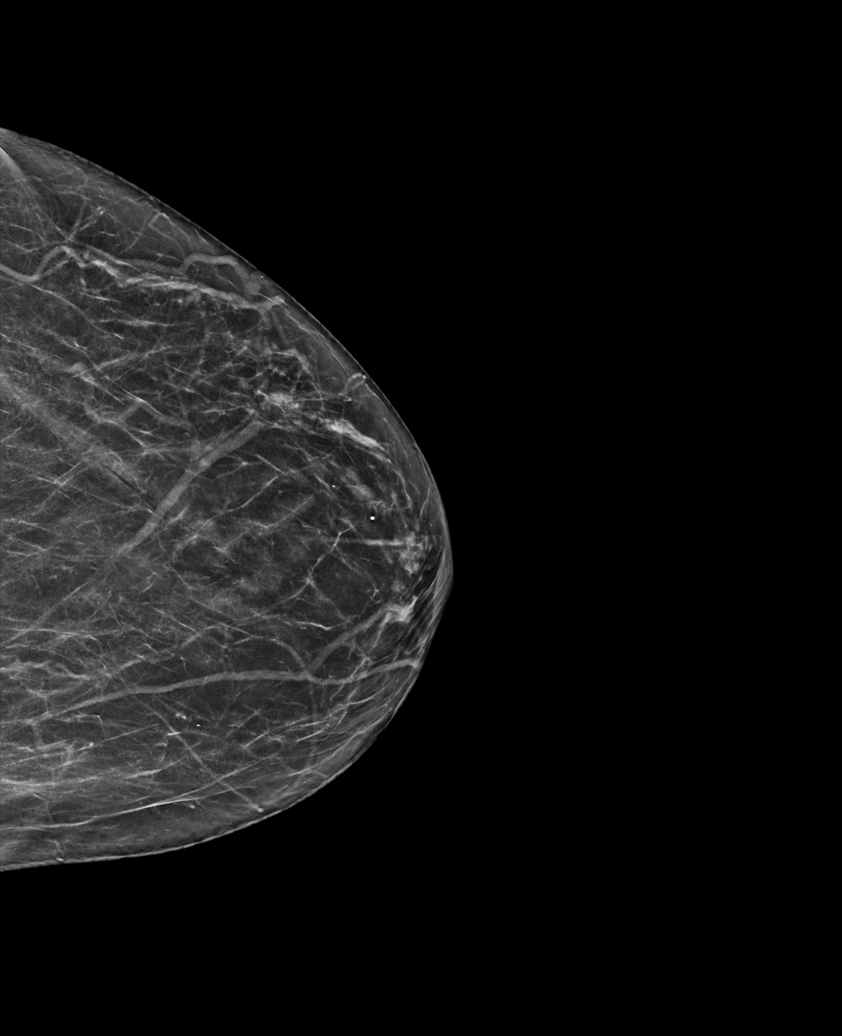

[R MLO synth-2D (2 of 2)]
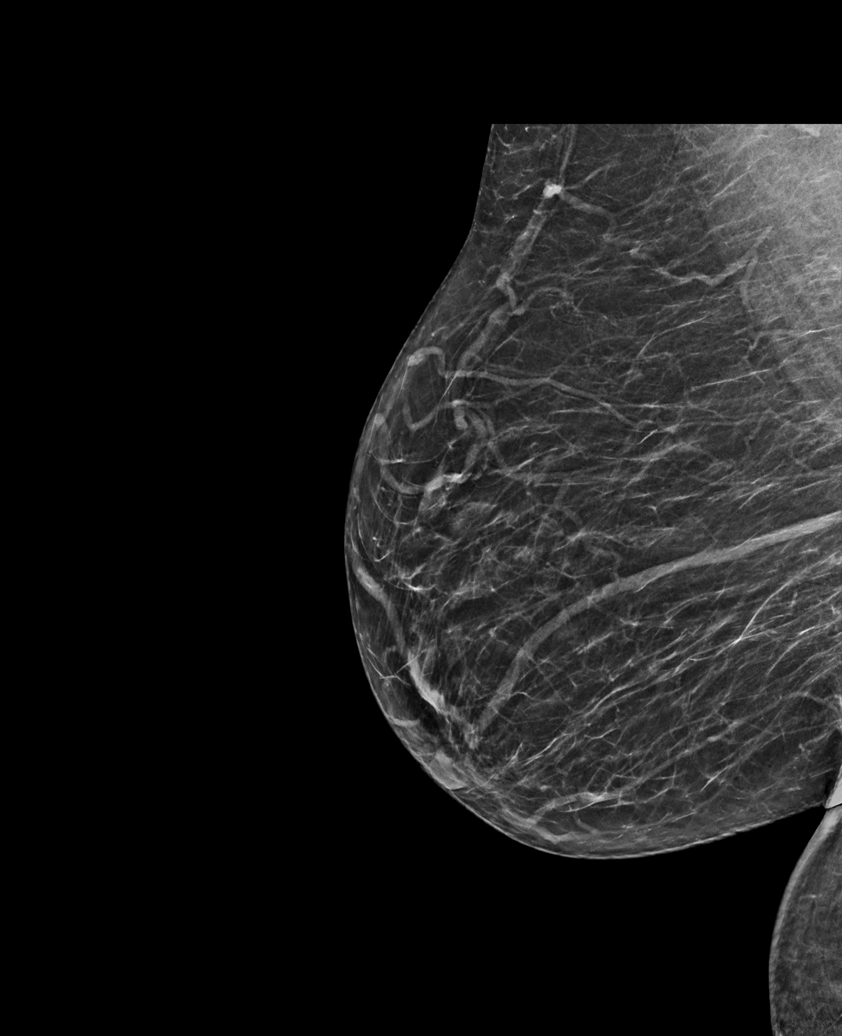

[R CC synth-2D]
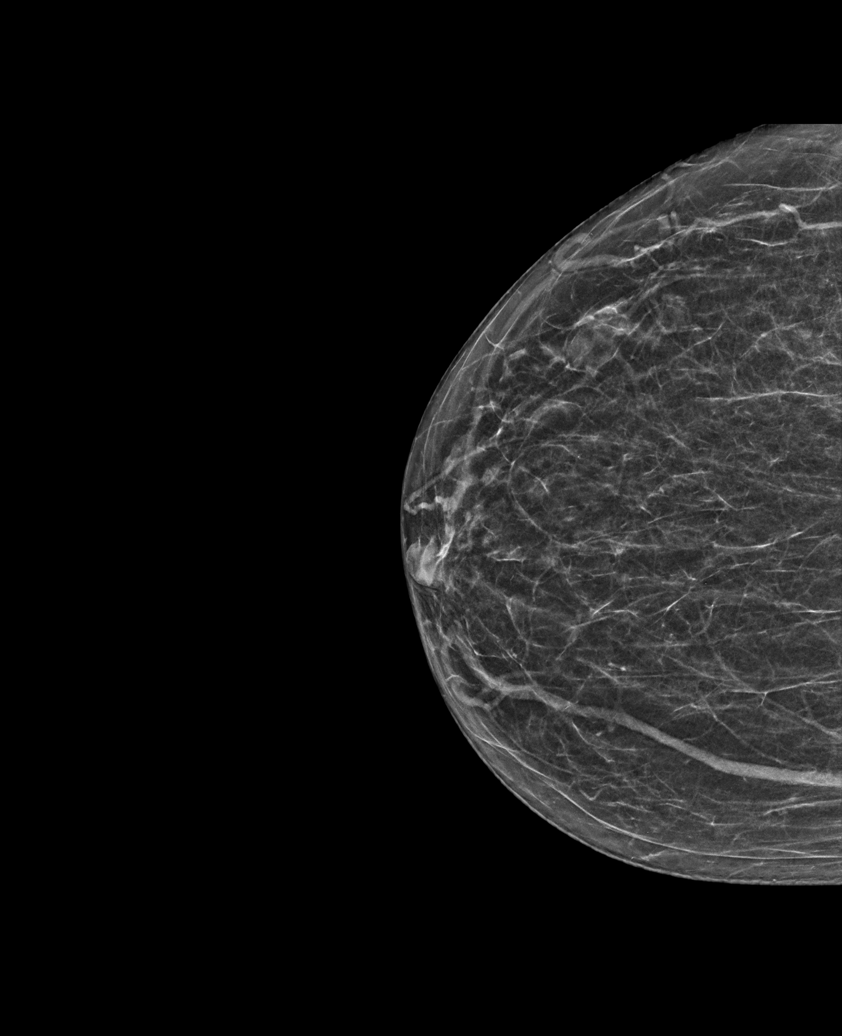

[6 of 36 positions shown; findings below may reference images not displayed]

ACR Breast Density Category b: There are scattered areas of
fibroglandular density.
FINDINGS: There are no findings suspicious for malignancy.
IMPRESSION: No mammographic evidence of malignancy. A result letter of this
screening mammogram will be mailed directly to the patient.

RECOMMENDATION:
Screening mammogram in one year. (Code:51-O-LD2)

BI-RADS CATEGORY  1: Negative.

## 2022-02-08 DIAGNOSIS — H903 Sensorineural hearing loss, bilateral: Secondary | ICD-10-CM | POA: Diagnosis not present

## 2022-02-08 DIAGNOSIS — H9313 Tinnitus, bilateral: Secondary | ICD-10-CM | POA: Diagnosis not present

## 2022-02-17 ENCOUNTER — Ambulatory Visit
Admission: EM | Admit: 2022-02-17 | Discharge: 2022-02-17 | Disposition: A | Discharge status: Home / Self Care | Payer: Medicare PPO | Attending: Physician Assistant | Admitting: Physician Assistant

## 2022-02-17 ENCOUNTER — Ambulatory Visit (INDEPENDENT_AMBULATORY_CARE_PROVIDER_SITE_OTHER): Payer: Medicare PPO

## 2022-02-17 DIAGNOSIS — M25571 Pain in right ankle and joints of right foot: Secondary | ICD-10-CM | POA: Diagnosis not present

## 2022-02-17 DIAGNOSIS — M25471 Effusion, right ankle: Secondary | ICD-10-CM

## 2022-02-17 DIAGNOSIS — M109 Gout, unspecified: Secondary | ICD-10-CM | POA: Diagnosis not present

## 2022-02-17 MED ORDER — COLCHICINE 0.6 MG PO TABS: ORAL_TABLET | ORAL | 0 refills | Status: DC

## 2022-02-17 NOTE — Discharge Instructions (Addendum)
I believe that she has gout.  Her x-ray was normal but did show soft tissue swelling.  Take colchicine as prescribed.  Can use Tylenol for additional pain relief.  Keep the leg elevated and use compression.  Follow-up with your primary care provider.  If at any point the swelling worsens or changes or if you develop chest pain, shortness of breath, heart racing you need to be seen immediately.

## 2022-02-17 NOTE — ED Provider Notes (Signed)
[facility]    CSN: [id number] Arrival date & time: 02/17/22  1241      History   Chief Complaint Chief Complaint  Patient presents with   Ankle Pain    HPI Wendy Rogers is a 81 y.o. female.   Patient presents today accompanied by her son who provide the majority of history.  Reports a 2-day history of right ankle pain and swelling.  Denies any known injury, increased activity, fall, trauma.  Reports pain is rated 10 on a 0-10 pain scale, described as sharp, worse with palpation, no alleviating factors identified.  She has not been taking any over-the-counter medication for symptom management.  She does have a history of neuropathy and has been taking gabapentin without improvement of symptoms.  She is diabetic but is diet controlled and her A1c was 5.3% in May 2023.  She denies history of gout or rheumatoid arthritis Per review of her chart does have this listed on her problem list.  She does report drinking alcohol few days ago but denies any recent dietary changes.  She does not take a thiazide diuretic.  She denies any recent travel, immobilization, surgical procedure, active malignancy, recent COVID-19 infection.  Denies chest pain, shortness of breath, palpitations.    Past Medical History:  Diagnosis Date   Diabetes mellitus without complication ([place])    Hypertension     Patient Active Problem List   Diagnosis Date Noted   Left shoulder pain 01/24/2020   History of gout 05/01/2019   Arthralgia 05/01/2019   Hyperlipidemia 08/15/2018   Ringing in ear, left 06/22/2017   Low TSH level 06/22/2017   Skin abnormalities 05/15/2017   Diabetic polyneuropathy ([place]) 07/04/2016   Foot pain, bilateral 07/04/2016   Low vitamin B12 level 07/04/2016   Essential hypertension, benign 12/29/2015   Diabetes ([place]) 12/29/2015   Dry eyes 12/29/2015    Past Surgical History:  Procedure Laterality Date   ABDOMINAL HYSTERECTOMY     fibroids   CARPAL TUNNEL RELEASE  Bilateral    CHOLECYSTECTOMY      OB History   No obstetric history on file.      Home Medications    Prior to Admission medications   Medication Sig Start Date End Date Taking? Authorizing Provider  colchicine 0.6 MG tablet Take 2 tablets day 1 then 1 tablet daily thereafter until symptoms resolve. 02/17/22  Yes Deckard Stuber K, PA-C  amLODipine (NORVASC) 5 MG tablet TAKE 1 AND 1/2 TABLETS(7.5 MG) BY MOUTH DAILY 04/01/21   Burns, Claudina Lick, MD  gabapentin (NEURONTIN) 100 MG capsule TAKE 1 CAPSULE(100 MG) BY MOUTH TWICE DAILY 04/01/21   Binnie Rail, MD  losartan (COZAAR) 50 MG tablet TAKE 1 TABLET(50 MG) BY MOUTH DAILY 04/01/21   Binnie Rail, MD  vitamin B-12 (CYANOCOBALAMIN) 1000 MCG tablet Take 1 tablet (1,000 mcg total) by mouth daily. 08/04/16   Binnie Rail, MD    Family History Family History  Problem Relation Age of Onset   Diabetes Mother    Pancreatic cancer Sister    Hypertension Father     Social History Social History   Tobacco Use   Smoking status: Never   Smokeless tobacco: Never  Substance Use Topics   Alcohol use: Yes    Comment: occasional, holidays   Drug use: No     Allergies   Penicillins   Review of Systems Review of Systems  Constitutional:  Positive for activity change. Negative for appetite change, fatigue and  fever.  Respiratory:  Negative for cough and shortness of breath.   Cardiovascular:  Negative for chest pain, palpitations and leg swelling.  Gastrointestinal:  Negative for abdominal pain, diarrhea, nausea and vomiting.  Musculoskeletal:  Positive for arthralgias, gait problem and joint swelling. Negative for myalgias.  Neurological:  Negative for weakness and numbness.     Physical Exam Triage Vital Signs ED Triage Vitals [02/17/22 1402]  Enc Vitals Group     BP (!) 150/84     Pulse Rate 99     Resp 17     Temp 97.7 F (36.5 C)     Temp Source Oral     SpO2 95 %     Weight      Height      Head Circumference       Peak Flow      Pain Score      Pain Loc      Pain Edu?      Excl. in GC?    No data found.  Updated Vital Signs BP (!) 150/84 (BP Location: Right Arm)   Pulse 99   Temp 97.7 F (36.5 C) (Oral)   Resp 17   SpO2 95%   Visual Acuity Right Eye Distance:   Left Eye Distance:   Bilateral Distance:    Right Eye Near:   Left Eye Near:    Bilateral Near:     Physical Exam Vitals reviewed.  Constitutional:      General: She is awake. She is not in acute distress.    Appearance: Normal appearance. She is well-developed. She is not ill-appearing.     Comments: Very pleasant female appears stated age in no acute distress sitting comfortably in exam room in wheelchair  HENT:     Head: Normocephalic and atraumatic.  Cardiovascular:     Rate and Rhythm: Normal rate and regular rhythm.     Pulses:          Posterior tibial pulses are 2+ on the right side and 2+ on the left side.     Heart sounds: Normal heart sounds, S1 normal and S2 normal. No murmur heard. Pulmonary:     Effort: Pulmonary effort is normal.     Breath sounds: Normal breath sounds. No wheezing, rhonchi or rales.     Comments: Clear to auscultation bilaterally Abdominal:     Palpations: Abdomen is soft.     Tenderness: There is no abdominal tenderness.  Musculoskeletal:     Right ankle: Swelling present. Tenderness present over the lateral malleolus and medial malleolus. Decreased range of motion.     Comments: Right ankle/foot: Significant swelling noted at bilateral malleoli but worse on lateral side.  Tender to palpation throughout this region.  Normal active range of motion.  No deformity noted.  Foot neurovascularly intact.  Psychiatric:        Behavior: Behavior is cooperative.      UC Treatments / Results  Labs (all labs ordered are listed, but only abnormal results are displayed) Labs Reviewed - No data to display  EKG   Radiology DG Ankle Complete Right  Result Date: 02/17/2022 CLINICAL DATA:   Right ankle pain and swelling.  No known injury EXAM: RIGHT ANKLE - COMPLETE 3+ VIEW COMPARISON:  None Available. FINDINGS: There is no evidence of fracture, dislocation, or joint effusion. Bidirectional calcaneal enthesophytes. Joint spaces are relatively well preserved. Diffuse soft tissue prominence about the ankle. IMPRESSION: No acute osseous abnormality. Diffuse soft tissue prominence  about the ankle. Electronically Signed   By: Duanne Guess D.O.   On: 02/17/2022 14:30    Procedures Procedures (including critical care time)  Medications Ordered in UC Medications - No data to display  Initial Impression / Assessment and Plan / UC Course  I have reviewed the triage vital signs and the nursing notes.  Pertinent labs & imaging results that were available during my care of the patient were reviewed by me and considered in my medical decision making (see chart for details).     X-ray was obtained to rule out pathological fracture which showed no acute osseous abnormality.  Suspect gout as etiology of symptoms though patient does not remember having history of gout.  Given her advanced age and history of diabetes will treat with colchicine.  No indication for dose adjustment based on metabolic panel from 10/08/2021 with creatinine of 0.84 and calculated creatinine clearance of 54.16 mL/min.  Recommended conservative treatment measures including Tylenol as well as keeping it elevated and using compression.  Recommend close follow-up with primary care.  Low suspicion for DVT given she has no significant risk factors and pain is very localized to the ankle without spread into calf.  Discussed that if she does develop any increased swelling, calf pain, palpitations, chest pain, shortness of breath she needs to go to the emergency room immediately.  Recommended follow-up with her primary care as soon as possible but preferably first thing next week.  Strict return precautions given to which patient and  son expressed understanding.  Final Clinical Impressions(s) / UC Diagnoses   Final diagnoses:  Acute gout of right ankle, unspecified cause  Pain and swelling of right ankle     Discharge Instructions      I believe that she has gout.  Her x-ray was normal but did show soft tissue swelling.  Take colchicine as prescribed.  Can use Tylenol for additional pain relief.  Keep the leg elevated and use compression.  Follow-up with your primary care provider.  If at any point the swelling worsens or changes or if you develop chest pain, shortness of breath, heart racing you need to be seen immediately.     ED Prescriptions     Medication Sig Dispense Auth. Provider   colchicine 0.6 MG tablet Take 2 tablets day 1 then 1 tablet daily thereafter until symptoms resolve. 6 tablet Breken Nazari, Noberto Retort, PA-C      PDMP not reviewed this encounter.   Jeani Hawking, PA-C 02/17/22 1452

## 2022-02-17 NOTE — ED Triage Notes (Signed)
Pt presents with right ankle and foot swelling since this morning with no known injury.

## 2022-02-18 ENCOUNTER — Other Ambulatory Visit: Payer: Self-pay | Admitting: Internal Medicine

## 2022-04-10 ENCOUNTER — Encounter: Payer: Self-pay | Admitting: Internal Medicine

## 2022-04-10 NOTE — Patient Instructions (Addendum)
      Blood work was ordered.   The lab is on the first floor.    Medications changes include :   increase gabapentin to 200 mg twice a day     Return in about 6 months (around 10/10/2022) for Physical Exam.

## 2022-04-10 NOTE — Progress Notes (Unsigned)
Subjective:    Patient ID: Wendy Rogers, female    DOB: 12-Jul-1940, 81 y.o.   MRN: [id number]     HPI Wendy Rogers is here for follow up of her chronic medical problems, including htn, DM with neuropathy, hld, low B12, h/o low tsh  Had gout.    Walks dog for exercise, eating well  Her gabapentin is not strong enough - has nerve pain  Medications and allergies reviewed with patient and updated if appropriate.  Current Outpatient Medications on File Prior to Visit  Medication Sig Dispense Refill   colchicine 0.6 MG tablet Take 2 tablets day 1 then 1 tablet daily thereafter until symptoms resolve. 6 tablet 0   vitamin B-12 (CYANOCOBALAMIN) 1000 MCG tablet Take 1 tablet (1,000 mcg total) by mouth daily.     No current facility-administered medications on file prior to visit.     Review of Systems  Constitutional:  Negative for chills and fever.  Respiratory:  Negative for cough, shortness of breath and wheezing.   Cardiovascular:  Negative for chest pain, palpitations and leg swelling.  Neurological:  Negative for light-headedness and headaches.       Objective:   Vitals:   04/11/22 1404  BP: 130/62  Pulse: 91  Temp: 98 F (36.7 C)  SpO2: 97%   BP Readings from Last 3 Encounters:  04/11/22 130/62  02/17/22 (!) 150/84  10/08/21 136/60   Wt Readings from Last 3 Encounters:  04/11/22 144 lb 3.2 oz (65.4 kg)  10/08/21 144 lb (65.3 kg)  03/19/21 148 lb (67.1 kg)   Body mass index is 30.14 kg/m.    Physical Exam Constitutional:      General: She is not in acute distress.    Appearance: Normal appearance.  HENT:     Head: Normocephalic and atraumatic.  Eyes:     Conjunctiva/sclera: Conjunctivae normal.  Cardiovascular:     Rate and Rhythm: Normal rate and regular rhythm.     Heart sounds: Normal heart sounds. No murmur heard. Pulmonary:     Effort: Pulmonary effort is normal. No respiratory distress.     Breath sounds: Normal breath sounds. No  wheezing.  Musculoskeletal:     Cervical back: Neck supple.     Right lower leg: No edema.     Left lower leg: No edema.  Lymphadenopathy:     Cervical: No cervical adenopathy.  Skin:    General: Skin is warm and dry.     Findings: No rash.  Neurological:     Mental Status: She is alert. Mental status is at baseline.  Psychiatric:        Mood and Affect: Mood normal.        Behavior: Behavior normal.        Lab Results  Component Value Date   WBC 5.0 10/08/2021   HGB 11.4 (L) 10/08/2021   HCT 34.6 (L) 10/08/2021   PLT 213.0 10/08/2021   GLUCOSE 92 10/08/2021   CHOL 197 10/08/2021   TRIG 161.0 (H) 10/08/2021   HDL 67.20 10/08/2021   LDLDIRECT 116.0 03/19/2021   LDLCALC 97 10/08/2021   ALT 20 10/08/2021   AST 23 10/08/2021   NA 141 10/08/2021   K 3.7 10/08/2021   CL 106 10/08/2021   CREATININE 0.84 10/08/2021   BUN 23 10/08/2021   CO2 26 10/08/2021   TSH 0.37 03/19/2021   HGBA1C 5.3 10/08/2021   MICROALBUR 2.0 (H) 12/29/2015     Assessment & Plan:  See Problem List for Assessment and Plan of chronic medical problems.

## 2022-04-11 ENCOUNTER — Ambulatory Visit (INDEPENDENT_AMBULATORY_CARE_PROVIDER_SITE_OTHER): Payer: Medicare PPO | Admitting: Internal Medicine

## 2022-04-11 VITALS — BP 130/62 | HR 91 | Temp 98.0°F | Ht <= 58 in | Wt 144.2 lb

## 2022-04-11 DIAGNOSIS — E782 Mixed hyperlipidemia: Secondary | ICD-10-CM

## 2022-04-11 DIAGNOSIS — E1142 Type 2 diabetes mellitus with diabetic polyneuropathy: Secondary | ICD-10-CM

## 2022-04-11 DIAGNOSIS — I10 Essential (primary) hypertension: Secondary | ICD-10-CM

## 2022-04-11 DIAGNOSIS — R7989 Other specified abnormal findings of blood chemistry: Secondary | ICD-10-CM | POA: Diagnosis not present

## 2022-04-11 DIAGNOSIS — Z8739 Personal history of other diseases of the musculoskeletal system and connective tissue: Secondary | ICD-10-CM | POA: Diagnosis not present

## 2022-04-11 LAB — CBC WITH DIFFERENTIAL/PLATELET
Basophils Absolute: 0 10*3/uL (ref 0.0–0.1)
Basophils Relative: 0.5 % (ref 0.0–3.0)
Eosinophils Absolute: 0 10*3/uL (ref 0.0–0.7)
Eosinophils Relative: 0.9 % (ref 0.0–5.0)
HCT: 33.4 % — ABNORMAL LOW (ref 36.0–46.0)
Hemoglobin: 11.1 g/dL — ABNORMAL LOW (ref 12.0–15.0)
Lymphocytes Relative: 41 % (ref 12.0–46.0)
Lymphs Abs: 1.9 10*3/uL (ref 0.7–4.0)
MCHC: 33.1 g/dL (ref 30.0–36.0)
MCV: 89.4 fl (ref 78.0–100.0)
Monocytes Absolute: 0.4 10*3/uL (ref 0.1–1.0)
Monocytes Relative: 7.7 % (ref 3.0–12.0)
Neutro Abs: 2.3 10*3/uL (ref 1.4–7.7)
Neutrophils Relative %: 49.9 % (ref 43.0–77.0)
Platelets: 225 10*3/uL (ref 150.0–400.0)
RBC: 3.74 Mil/uL — ABNORMAL LOW (ref 3.87–5.11)
RDW: 15.3 % (ref 11.5–15.5)
WBC: 4.7 10*3/uL (ref 4.0–10.5)

## 2022-04-11 LAB — TSH: TSH: 0.31 u[IU]/mL — ABNORMAL LOW (ref 0.35–5.50)

## 2022-04-11 LAB — VITAMIN B12: Vitamin B-12: 610 pg/mL (ref 211–911)

## 2022-04-11 LAB — HEMOGLOBIN A1C: Hgb A1c MFr Bld: 5.4 % (ref 4.6–6.5)

## 2022-04-11 MED ORDER — LOSARTAN POTASSIUM 50 MG PO TABS: ORAL_TABLET | ORAL | 1 refills | Status: DC

## 2022-04-11 MED ORDER — AMLODIPINE BESYLATE 5 MG PO TABS: ORAL_TABLET | ORAL | 1 refills | Status: DC

## 2022-04-11 MED ORDER — GABAPENTIN 100 MG PO CAPS: 200.0000 mg | ORAL_CAPSULE | Freq: Two times a day (BID) | ORAL | 5 refills | Status: DC

## 2022-04-11 NOTE — Assessment & Plan Note (Signed)
Chronic H/o gout Check uric acid level Continue colchicine as needed for flares

## 2022-04-11 NOTE — Assessment & Plan Note (Addendum)
Chronic BP well controlled Continue amlodipine 7.5 mg daily, losartan 50 mg daily cmp

## 2022-04-11 NOTE — Assessment & Plan Note (Addendum)
Chronic Not controlled Increase gabapentin 200 mg bid - discussed risk of drowsiness - increase nighttime dose first and then consider increasing am dose

## 2022-04-11 NOTE — Assessment & Plan Note (Signed)
Chronic   Lab Results  Component Value Date   HGBA1C 5.3 10/08/2021   Sugars well controlled Check A1c, urine microalbumin today Continue lifestyle control Stressed regular exercise, diabetic diet

## 2022-04-11 NOTE — Assessment & Plan Note (Signed)
Chronic Check lipid panel  Deferred statin Continue lifestyle Regular exercise and healthy diet encouraged

## 2022-04-11 NOTE — Assessment & Plan Note (Signed)
Chronic Continue B12 supplementation Check B12 level 

## 2022-04-11 NOTE — Assessment & Plan Note (Signed)
H/o low tsh Check tsh today Clinically euthyroid

## 2022-04-12 LAB — COMPREHENSIVE METABOLIC PANEL
ALT: 17 U/L (ref 0–35)
AST: 24 U/L (ref 0–37)
Albumin: 4.1 g/dL (ref 3.5–5.2)
Alkaline Phosphatase: 84 U/L (ref 39–117)
BUN: 28 mg/dL — ABNORMAL HIGH (ref 6–23)
CO2: 26 mEq/L (ref 19–32)
Calcium: 9.8 mg/dL (ref 8.4–10.5)
Chloride: 107 mEq/L (ref 96–112)
Creatinine, Ser: 0.99 mg/dL (ref 0.40–1.20)
GFR: 53.35 mL/min — ABNORMAL LOW (ref >60.00)
Glucose, Bld: 102 mg/dL — ABNORMAL HIGH (ref 70–99)
Potassium: 3.2 mEq/L — ABNORMAL LOW (ref 3.5–5.1)
Sodium: 142 mEq/L (ref 135–145)
Total Bilirubin: 0.4 mg/dL (ref 0.2–1.2)
Total Protein: 7 g/dL (ref 6.0–8.3)

## 2022-04-12 LAB — MICROALBUMIN / CREATININE URINE RATIO
Creatinine,U: 70.8 mg/dL
Microalb Creat Ratio: 1 mg/g (ref 0.0–30.0)
Microalb, Ur: <0.7 mg/dL (ref 0.0–1.9)

## 2022-04-12 LAB — LIPID PANEL
Cholesterol: 200 mg/dL (ref 0–200)
HDL: 67 mg/dL (ref >39.00)
NonHDL: 133.27
Total CHOL/HDL Ratio: 3
Triglycerides: 222 mg/dL — ABNORMAL HIGH (ref 0.0–149.0)
VLDL: 44.4 mg/dL — ABNORMAL HIGH (ref 0.0–40.0)

## 2022-04-12 LAB — URIC ACID: Uric Acid, Serum: 7.4 mg/dL — ABNORMAL HIGH (ref 2.4–7.0)

## 2022-04-12 LAB — LDL CHOLESTEROL, DIRECT: Direct LDL: 114 mg/dL

## 2022-04-13 MED ORDER — POTASSIUM CHLORIDE CRYS ER 20 MEQ PO TBCR: 20.0000 meq | EXTENDED_RELEASE_TABLET | Freq: Every day | ORAL | 5 refills | Status: DC

## 2022-04-13 NOTE — Addendum Note (Signed)
Addended by: Pincus Sanes on: 04/13/2022 07:24 PM   Modules accepted: Orders

## 2022-04-19 DIAGNOSIS — D3131 Benign neoplasm of right choroid: Secondary | ICD-10-CM | POA: Diagnosis not present

## 2022-04-19 DIAGNOSIS — H26493 Other secondary cataract, bilateral: Secondary | ICD-10-CM | POA: Diagnosis not present

## 2022-04-19 DIAGNOSIS — H04123 Dry eye syndrome of bilateral lacrimal glands: Secondary | ICD-10-CM | POA: Diagnosis not present

## 2022-04-19 DIAGNOSIS — E119 Type 2 diabetes mellitus without complications: Secondary | ICD-10-CM | POA: Diagnosis not present

## 2022-04-19 DIAGNOSIS — Z961 Presence of intraocular lens: Secondary | ICD-10-CM | POA: Diagnosis not present

## 2022-04-19 LAB — HM DIABETES EYE EXAM

## 2022-04-30 ENCOUNTER — Other Ambulatory Visit: Payer: Self-pay | Admitting: Internal Medicine

## 2022-04-30 MED ORDER — ALLOPURINOL 100 MG PO TABS: 100.0000 mg | ORAL_TABLET | Freq: Every day | ORAL | 6 refills | Status: DC

## 2022-05-04 ENCOUNTER — Encounter: Payer: Self-pay | Admitting: Internal Medicine

## 2022-05-04 NOTE — Progress Notes (Signed)
Outside notes received. Information abstracted. Notes sent to scan.  

## 2022-10-13 ENCOUNTER — Encounter: Payer: Self-pay | Admitting: Internal Medicine

## 2022-10-13 NOTE — Patient Instructions (Addendum)
      Blood work was ordered.   The lab is on the first floor.    Medications changes include :   try gabapentin 300 mg twice a day.    A referral was ordered neurology and someone will call you to schedule an appointment.    A bone density was ordered.     Return in about 6 months (around 04/16/2023) for Physical Exam.

## 2022-10-13 NOTE — Progress Notes (Signed)
Subjective:    Patient ID: Wendy Rogers, female    DOB: 10/10/1940, 82 y.o.   MRN: [id number]     HPI Wendy Rogers is here for follow up of her chronic medical problems.  Feels cold all the time - her arms and legs feel a little numb/tingling.   Gabapentin helps - takes 2 bid.  She feels this does help.   Walks daily.    Medications and allergies reviewed with patient and updated if appropriate.  Current Outpatient Medications on File Prior to Visit  Medication Sig Dispense Refill   allopurinol (ZYLOPRIM) 100 MG tablet Take 1 tablet (100 mg total) by mouth daily. 30 tablet 6   amLODipine (NORVASC) 5 MG tablet TAKE 1 AND 1/2 TABLETS(7.5 MG) BY MOUTH DAILY 135 tablet 1   losartan (COZAAR) 50 MG tablet TAKE 1 TABLET(50 MG) BY MOUTH DAILY 90 tablet 1   potassium chloride SA (KLOR-CON M) 20 MEQ tablet Take 1 tablet (20 mEq total) by mouth daily. 30 tablet 5   vitamin B-12 (CYANOCOBALAMIN) 1000 MCG tablet Take 1 tablet (1,000 mcg total) by mouth daily.     colchicine 0.6 MG tablet Take 2 tablets day 1 then 1 tablet daily thereafter until symptoms resolve. 6 tablet 0   No current facility-administered medications on file prior to visit.     Review of Systems  Constitutional:  Negative for fever.  HENT:  Positive for voice change. Negative for postnasal drip.   Respiratory:  Negative for cough, shortness of breath and wheezing.   Cardiovascular:  Negative for chest pain, palpitations and leg swelling.  Gastrointestinal:  Negative for abdominal pain, blood in stool (no black stool), constipation and diarrhea.       No gerd  Genitourinary:  Negative for hematuria.  Neurological:  Positive for numbness (N/T in feet comes up legs and sometimes in arms). Negative for light-headedness and headaches.        Objective:   Vitals:   10/14/22 1257  BP: 136/74  Pulse: 80  Temp: 97.7 F (36.5 C)  SpO2: 97%   BP Readings from Last 3 Encounters:  10/14/22 136/74  04/11/22  130/62  02/17/22 (!) 150/84   Wt Readings from Last 3 Encounters:  10/14/22 145 lb 8 oz (66 kg)  04/11/22 144 lb 3.2 oz (65.4 kg)  10/08/21 144 lb (65.3 kg)   Body mass index is 30.41 kg/m.    Physical Exam Constitutional:      General: She is not in acute distress.    Appearance: Normal appearance.  HENT:     Head: Normocephalic and atraumatic.  Eyes:     Conjunctiva/sclera: Conjunctivae normal.  Cardiovascular:     Rate and Rhythm: Normal rate and regular rhythm.     Heart sounds: Normal heart sounds.  Pulmonary:     Effort: Pulmonary effort is normal. No respiratory distress.     Breath sounds: Normal breath sounds. No wheezing.  Musculoskeletal:     Cervical back: Neck supple.     Right lower leg: No edema.     Left lower leg: No edema.  Lymphadenopathy:     Cervical: No cervical adenopathy.  Skin:    General: Skin is warm and dry.     Findings: No rash.  Neurological:     Mental Status: She is alert. Mental status is at baseline.     Sensory: Sensory deficit (decreased sensation to light touch b/l distal lower extremities) present.  Psychiatric:  Mood and Affect: Mood normal.        Behavior: Behavior normal.        Lab Results  Component Value Date   WBC 4.7 04/11/2022   HGB 11.1 (L) 04/11/2022   HCT 33.4 (L) 04/11/2022   PLT 225.0 04/11/2022   GLUCOSE 102 (H) 04/11/2022   CHOL 200 04/11/2022   TRIG 222.0 (H) 04/11/2022   HDL 67.00 04/11/2022   LDLDIRECT 114.0 04/11/2022   LDLCALC 97 10/08/2021   ALT 17 04/11/2022   AST 24 04/11/2022   NA 142 04/11/2022   K 3.2 (L) 04/11/2022   CL 107 04/11/2022   CREATININE 0.99 04/11/2022   BUN 28 (H) 04/11/2022   CO2 26 04/11/2022   TSH 0.31 (L) 04/11/2022   HGBA1C 5.4 04/11/2022   MICROALBUR <0.7 04/11/2022    Assessment & Plan:    See Problem List for Assessment and Plan of chronic medical problems.

## 2022-10-14 ENCOUNTER — Ambulatory Visit: Payer: Medicare HMO | Admitting: Internal Medicine

## 2022-10-14 VITALS — BP 136/74 | HR 80 | Temp 97.7°F | Ht <= 58 in | Wt 145.5 lb

## 2022-10-14 DIAGNOSIS — E782 Mixed hyperlipidemia: Secondary | ICD-10-CM | POA: Diagnosis not present

## 2022-10-14 DIAGNOSIS — E2839 Other primary ovarian failure: Secondary | ICD-10-CM

## 2022-10-14 DIAGNOSIS — M109 Gout, unspecified: Secondary | ICD-10-CM | POA: Diagnosis not present

## 2022-10-14 DIAGNOSIS — E876 Hypokalemia: Secondary | ICD-10-CM | POA: Diagnosis not present

## 2022-10-14 DIAGNOSIS — Z13820 Encounter for screening for osteoporosis: Secondary | ICD-10-CM

## 2022-10-14 DIAGNOSIS — D649 Anemia, unspecified: Secondary | ICD-10-CM | POA: Insufficient documentation

## 2022-10-14 DIAGNOSIS — I10 Essential (primary) hypertension: Secondary | ICD-10-CM

## 2022-10-14 DIAGNOSIS — E1142 Type 2 diabetes mellitus with diabetic polyneuropathy: Secondary | ICD-10-CM

## 2022-10-14 DIAGNOSIS — E0590 Thyrotoxicosis, unspecified without thyrotoxic crisis or storm: Secondary | ICD-10-CM | POA: Diagnosis not present

## 2022-10-14 DIAGNOSIS — R7989 Other specified abnormal findings of blood chemistry: Secondary | ICD-10-CM | POA: Diagnosis not present

## 2022-10-14 LAB — COMPREHENSIVE METABOLIC PANEL
ALT: 17 U/L (ref 0–35)
AST: 23 U/L (ref 0–37)
Albumin: 3.8 g/dL (ref 3.5–5.2)
Alkaline Phosphatase: 96 U/L (ref 39–117)
BUN: 20 mg/dL (ref 6–23)
CO2: 29 mEq/L (ref 19–32)
Calcium: 10.1 mg/dL (ref 8.4–10.5)
Chloride: 107 mEq/L (ref 96–112)
Creatinine, Ser: 0.87 mg/dL (ref 0.40–1.20)
GFR: 62.08 mL/min (ref >60.00)
Glucose, Bld: 102 mg/dL — ABNORMAL HIGH (ref 70–99)
Potassium: 3.9 mEq/L (ref 3.5–5.1)
Sodium: 143 mEq/L (ref 135–145)
Total Bilirubin: 0.3 mg/dL (ref 0.2–1.2)
Total Protein: 7.1 g/dL (ref 6.0–8.3)

## 2022-10-14 LAB — CBC WITH DIFFERENTIAL/PLATELET
Basophils Absolute: 0 10*3/uL (ref 0.0–0.1)
Basophils Relative: 0.5 % (ref 0.0–3.0)
Eosinophils Absolute: 0 10*3/uL (ref 0.0–0.7)
Eosinophils Relative: 0.7 % (ref 0.0–5.0)
HCT: 34.2 % — ABNORMAL LOW (ref 36.0–46.0)
Hemoglobin: 11.3 g/dL — ABNORMAL LOW (ref 12.0–15.0)
Lymphocytes Relative: 41.5 % (ref 12.0–46.0)
Lymphs Abs: 1.8 10*3/uL (ref 0.7–4.0)
MCHC: 32.9 g/dL (ref 30.0–36.0)
MCV: 88.5 fl (ref 78.0–100.0)
Monocytes Absolute: 0.4 10*3/uL (ref 0.1–1.0)
Monocytes Relative: 8.7 % (ref 3.0–12.0)
Neutro Abs: 2.2 10*3/uL (ref 1.4–7.7)
Neutrophils Relative %: 48.6 % (ref 43.0–77.0)
Platelets: 293 10*3/uL (ref 150.0–400.0)
RBC: 3.87 Mil/uL (ref 3.87–5.11)
RDW: 14.6 % (ref 11.5–15.5)
WBC: 4.4 10*3/uL (ref 4.0–10.5)

## 2022-10-14 LAB — URIC ACID: Uric Acid, Serum: 5.8 mg/dL (ref 2.4–7.0)

## 2022-10-14 LAB — LIPID PANEL
Cholesterol: 179 mg/dL (ref 0–200)
HDL: 57.7 mg/dL (ref >39.00)
LDL Cholesterol: 95 mg/dL (ref 0–99)
NonHDL: 121.58
Total CHOL/HDL Ratio: 3
Triglycerides: 133 mg/dL (ref 0.0–149.0)
VLDL: 26.6 mg/dL (ref 0.0–40.0)

## 2022-10-14 LAB — IBC PANEL
Iron: 81 ug/dL (ref 42–145)
Saturation Ratios: 25.9 % (ref 20.0–50.0)
TIBC: 312.2 ug/dL (ref 250.0–450.0)
Transferrin: 223 mg/dL (ref 212.0–360.0)

## 2022-10-14 LAB — TSH: TSH: 0.22 u[IU]/mL — ABNORMAL LOW (ref 0.35–5.50)

## 2022-10-14 LAB — FERRITIN: Ferritin: 149 ng/mL (ref 10.0–291.0)

## 2022-10-14 LAB — HEMOGLOBIN A1C: Hgb A1c MFr Bld: 5.5 % (ref 4.6–6.5)

## 2022-10-14 MED ORDER — GABAPENTIN 100 MG PO CAPS: 300.0000 mg | ORAL_CAPSULE | Freq: Two times a day (BID) | ORAL | 5 refills | Status: DC

## 2022-10-14 NOTE — Assessment & Plan Note (Signed)
Chronic Check lipid panel  Deferred statin Continue lifestyle Regular exercise and healthy diet encouraged  

## 2022-10-14 NOTE — Assessment & Plan Note (Addendum)
Chronic Never officially diagnosed with neuropathy-presumed diagnosis Controlled Currently taking gabapentin 200 mg bid which is helping-advised to try 300 mg twice daily since she is tolerating this well without side effects.  Discussed possible dizziness/drowsiness Will refer to neurology to evaluate further given how well her sugars are controlled make sure there is no other cause

## 2022-10-14 NOTE — Assessment & Plan Note (Signed)
Chronic BP well controlled Continue amlodipine 7.5 mg daily, losartan 50 mg daily cmp  

## 2022-10-14 NOTE — Assessment & Plan Note (Signed)
Chronic Clinically euthyroid TSH

## 2022-10-14 NOTE — Assessment & Plan Note (Signed)
Chronic Check CBC, iron levels B12 level was normal last checked and she is taking supplementation daily Denies need blood in stool, melena or hematuria

## 2022-10-14 NOTE — Assessment & Plan Note (Signed)
Chronic   Lab Results  Component Value Date   HGBA1C 5.4 04/11/2022   Sugars well controlled Check A1c, urine microalbumin today Continue lifestyle control Stressed regular exercise, diabetic diet

## 2022-10-14 NOTE — Assessment & Plan Note (Signed)
Chronic Continue B12 supplementation 

## 2022-10-14 NOTE — Assessment & Plan Note (Signed)
Chronic No recent flares Check uric acid level Continue allopurinol 100 mg daily Continue colchicine as needed for flares

## 2022-10-14 NOTE — Assessment & Plan Note (Signed)
Chronic °CMP °Continue potassium chloride 20 mEq daily °

## 2022-10-16 ENCOUNTER — Encounter: Payer: Self-pay | Admitting: Internal Medicine

## 2022-10-27 NOTE — Progress Notes (Signed)
Letter printed.

## 2022-11-01 ENCOUNTER — Encounter: Payer: Self-pay | Admitting: Neurology

## 2022-11-02 ENCOUNTER — Other Ambulatory Visit: Payer: Self-pay | Admitting: Internal Medicine

## 2022-11-02 DIAGNOSIS — Z1231 Encounter for screening mammogram for malignant neoplasm of breast: Secondary | ICD-10-CM

## 2022-11-07 ENCOUNTER — Encounter: Payer: Self-pay | Admitting: Neurology

## 2022-11-07 ENCOUNTER — Ambulatory Visit: Payer: Medicare HMO | Admitting: Neurology

## 2022-11-07 VITALS — BP 150/73 | HR 108 | Ht <= 58 in | Wt 145.0 lb

## 2022-11-07 DIAGNOSIS — R202 Paresthesia of skin: Secondary | ICD-10-CM

## 2022-11-07 NOTE — Progress Notes (Signed)
[facility] Clinic Note - Initial Visit   Date: 11/07/2022   Wendy Rogers MRN: [id number] DOB: February 14, 1941   Dear Dr. Lawerance Bach:  Thank you for your kind referral of Annabella Huller for consultation of paresthesias. Although her history is well known to you, please allow [place] to reiterate it for the purpose of our medical record. The patient was accompanied to the clinic by self.    Wendy Rogers is a 82 y.o. right-handed female with well-controlled diabetes mellitus, hyperlipidemia, and hypertension  presenting for evaluation of numbness/tingling.   IMPRESSION/PLAN: Generalized paresthesias of the arms and legs, which are intermittent and does not fit a cutaneous nerve/dermatomal distribution.  Exam does not suggest neuropathy or myelopathy.  Symptoms are controlled on gabapentin 200mg  BID, however she admits to occasionally skipping the evening dose which is when she experiences the numbness/tingling more.  I reassured patient that I did not see anything worrisome.  If she would like to do additional testing, NCS/EMG can be performed but given symptoms are mild and controlled it is reasonable for her to continue gabapentin 200mg  BID.  I suggested that she set an alarm on her phone to remind her to take evening dose.    Return to clinic as needed  ------------------------------------------------------------- History of present illness: She reports having spells of numbness/tingling involving the the feet, legs, and arms.  It occurs about twice per week and lasts 5 minutes. There is no specific triggers.  No imbalance, weakness, neck pain, or falls.   She reports that gabapentin 200mg  twice daily helps.  However, if she skips her evening dose, she tends to feel the numbness/tingling more.    She lives with her son.    Out-side paper records, electronic medical record, and images have been reviewed where available and summarized as:  Lab Results   Component Value Date   HGBA1C 5.5 10/14/2022   Lab Results  Component Value Date   VITAMINB12 610 04/11/2022   Lab Results  Component Value Date   TSH 0.22 (L) 10/14/2022   Lab Results  Component Value Date   ESRSEDRATE 130 (H) 05/01/2019    Past Medical History:  Diagnosis Date   Diabetes mellitus without complication (HCC)    Hypertension     Past Surgical History:  Procedure Laterality Date   ABDOMINAL HYSTERECTOMY     fibroids   CARPAL TUNNEL RELEASE Bilateral    CHOLECYSTECTOMY       Medications:  Outpatient Encounter Medications as of 11/07/2022  Medication Sig   allopurinol (ZYLOPRIM) 100 MG tablet Take 1 tablet (100 mg total) by mouth daily.   amLODipine (NORVASC) 5 MG tablet TAKE 1 AND 1/2 TABLETS(7.5 MG) BY MOUTH DAILY   gabapentin (NEURONTIN) 100 MG capsule Take 3 capsules (300 mg total) by mouth 2 (two) times daily. (Patient taking differently: Take 300 mg by mouth 2 (two) times daily. Two capsules twice daily)   losartan (COZAAR) 50 MG tablet TAKE 1 TABLET(50 MG) BY MOUTH DAILY   vitamin B-12 (CYANOCOBALAMIN) 1000 MCG tablet Take 1 tablet (1,000 mcg total) by mouth daily.   [DISCONTINUED] colchicine 0.6 MG tablet Take 2 tablets day 1 then 1 tablet daily thereafter until symptoms resolve. (Patient not taking: Reported on 11/07/2022)   [DISCONTINUED] potassium chloride SA (KLOR-CON M) 20 MEQ tablet Take 1 tablet (20 mEq total) by mouth daily. (Patient not taking: Reported on 11/07/2022)   No facility-administered encounter medications on file as of 11/07/2022.    Allergies:  Allergies  Allergen Reactions   Penicillins Hives    Family History: Family History  Problem Relation Age of Onset   Diabetes Mother    Hypertension Father    Pancreatic cancer Sister     Social History: Social History   Tobacco Use   Smoking status: Never   Smokeless tobacco: Never  Substance Use Topics   Alcohol use: Yes    Comment: occasional, holidays   Drug use: No    Social History   Social History Narrative   She walks her dog routinely.    Previously worked as a Tree surgeon.   Right handed.   One level with son.   Caffeine - soda 1 bottle/day   Education - 2 years college    Vital Signs:  BP (!) 150/73   Pulse (!) 108   Ht 4\' 10"  (1.473 m)   Wt 145 lb (65.8 kg)   BMI 30.31 kg/m   Neurological Exam: MENTAL STATUS including orientation to time, place, person, recent and remote memory, attention span and concentration, language, and fund of knowledge is normal.  Speech is not dysarthric.  CRANIAL NERVES: II:  No visual field defects.     III-IV-VI: Pupils equal round and reactive to light.  Normal conjugate, extra-ocular eye movements in all directions of gaze.  No nystagmus.  No ptosis.   V:  Normal facial sensation.    VII:  Normal facial symmetry and movements.   VIII:  Normal hearing and vestibular function.   IX-X:  Normal palatal movement.   XI:  Normal shoulder shrug and head rotation.   XII:  Normal tongue strength and range of motion, no deviation or fasciculation.  MOTOR:  No atrophy, fasciculations or abnormal movements.  No pronator drift.   Upper Extremity:  Right  Left  Deltoid  5/5   5/5   Biceps  5/5   5/5   Triceps  5/5   5/5   Wrist extensors  5/5   5/5   Wrist flexors  5/5   5/5   Finger extensors  5/5   5/5   Finger flexors  5/5   5/5   Dorsal interossei  5/5   5/5   Abductor pollicis  5/5   5/5   Tone (Ashworth scale)  0  0   Lower Extremity:  Right  Left  Hip flexors  5/5   5/5   Knee flexors  5/5   5/5   Knee extensors  5/5   5/5   Dorsiflexors  5/5   5/5   Plantarflexors  5/5   5/5   Toe extensors  5/5   5/5   Toe flexors  5/5   5/5   Tone (Ashworth scale)  0  0   MSRs:                                           Right        Left brachioradialis 2+  2+  biceps 2+  2+  triceps 2+  2+  patellar 2+  2+  ankle jerk 2+  2+  Hoffman no  no  plantar response down  down   SENSORY:  Normal and  symmetric perception of light touch, pinprick, vibration, and temperature.    COORDINATION/GAIT: Normal finger-to- nose-finger.  Intact rapid alternating movements bilaterally.  Able to rise from a chair without using  arms.  Gait narrow based and stable.    Thank you for allowing me to participate in patient's care.  If I can answer any additional questions, I would be pleased to do so.    Sincerely,    Shanese Riemenschneider K. Allena Katz, DO

## 2022-11-07 NOTE — Patient Instructions (Signed)
Continue to take gabapentin 200mg  twice daily.  You may want to set an alarm on your phone to remind you to take the night time dose.

## 2022-11-17 ENCOUNTER — Ambulatory Visit (INDEPENDENT_AMBULATORY_CARE_PROVIDER_SITE_OTHER): Payer: Medicare HMO

## 2022-11-17 VITALS — Ht <= 58 in | Wt 145.0 lb

## 2022-11-17 DIAGNOSIS — Z0000 Encounter for general adult medical examination without abnormal findings: Secondary | ICD-10-CM | POA: Diagnosis not present

## 2022-11-17 NOTE — Patient Instructions (Addendum)
Ms. Wendy Rogers , Thank you for taking time to come for your Medicare Wellness Visit. I appreciate your ongoing commitment to your health goals. Please review the following plan we discussed and let me know if I can assist you in the future.   These are the goals we discussed:  Goals       Live a good life (pt-stated)      Patient Stated      Maintain current health status, continue to eat healthy, exercise, enjoy life and family.        This is a list of the screening recommended for you and due dates:  Health Maintenance  Topic Date Due   DEXA scan (bone density measurement)  Never done   Complete foot exam   04/14/2020   COVID-19 Vaccine (4 - 2023-24 season) 12/03/2022*   Pneumonia Vaccine (1 of 1 - PCV) 10/14/2023*   Flu Shot  12/29/2022   Yearly kidney health urinalysis for diabetes  04/12/2023   Hemoglobin A1C  04/16/2023   Eye exam for diabetics  04/20/2023   Yearly kidney function blood test for diabetes  10/14/2023   Medicare Annual Wellness Visit  11/17/2023   HPV Vaccine  Aged Out   DTaP/Tdap/Td vaccine  Discontinued   Zoster (Shingles) Vaccine  Discontinued  *Topic was postponed. The date shown is not the original due date.    Advanced directives: Advance directive discussed with you today. Even though you declined this today, please call our office should you change your mind, and we can give you the proper paperwork for you to fill out.   Conditions/risks identified: None  Next appointment: Follow up in one year for your annual wellness visit    Preventive Care 65 Years and Older, Female Preventive care refers to lifestyle choices and visits with your health care provider that can promote health and wellness. What does preventive care include? A yearly physical exam. This is also called an annual well check. Dental exams once or twice a year. Routine eye exams. Ask your health care provider how often you should have your eyes checked. Personal lifestyle  choices, including: Daily care of your teeth and gums. Regular physical activity. Eating a healthy diet. Avoiding tobacco and drug use. Limiting alcohol use. Practicing safe sex. Taking low-dose aspirin every day. Taking vitamin and mineral supplements as recommended by your health care provider. What happens during an annual well check? The services and screenings done by your health care provider during your annual well check will depend on your age, overall health, lifestyle risk factors, and family history of disease. Counseling  Your health care provider may ask you questions about your: Alcohol use. Tobacco use. Drug use. Emotional well-being. Home and relationship well-being. Sexual activity. Eating habits. History of falls. Memory and ability to understand (cognition). Work and work Astronomer. Reproductive health. Screening  You may have the following tests or measurements: Height, weight, and BMI. Blood pressure. Lipid and cholesterol levels. These may be checked every 5 years, or more frequently if you are over 79 years old. Skin check. Lung cancer screening. You may have this screening every year starting at age 48 if you have a 30-pack-year history of smoking and currently smoke or have quit within the past 15 years. Fecal occult blood test (FOBT) of the stool. You may have this test every year starting at age 30. Flexible sigmoidoscopy or colonoscopy. You may have a sigmoidoscopy every 5 years or a colonoscopy every 10 years starting at  age 24. Hepatitis C blood test. Hepatitis B blood test. Sexually transmitted disease (STD) testing. Diabetes screening. This is done by checking your blood sugar (glucose) after you have not eaten for a while (fasting). You may have this done every 1-3 years. Bone density scan. This is done to screen for osteoporosis. You may have this done starting at age 63. Mammogram. This may be done every 1-2 years. Talk to your health care  provider about how often you should have regular mammograms. Talk with your health care provider about your test results, treatment options, and if necessary, the need for more tests. Vaccines  Your health care provider may recommend certain vaccines, such as: Influenza vaccine. This is recommended every year. Tetanus, diphtheria, and acellular pertussis (Tdap, Td) vaccine. You may need a Td booster every 10 years. Zoster vaccine. You may need this after age 83. Pneumococcal 13-valent conjugate (PCV13) vaccine. One dose is recommended after age 57. Pneumococcal polysaccharide (PPSV23) vaccine. One dose is recommended after age 58. Talk to your health care provider about which screenings and vaccines you need and how often you need them. This information is not intended to replace advice given to you by your health care provider. Make sure you discuss any questions you have with your health care provider. Document Released: 06/12/2015 Document Revised: 02/03/2016 Document Reviewed: 03/17/2015 Elsevier Interactive Patient Education  2017 [place] Prevention in the Home Falls can cause injuries. They can happen to people of all ages. There are many things you can do to make your home safe and to help prevent falls. What can I do on the outside of my home? Regularly fix the edges of walkways and driveways and fix any cracks. Remove anything that might make you trip as you walk through a door, such as a raised step or threshold. Trim any bushes or trees on the path to your home. Use bright outdoor lighting. Clear any walking paths of anything that might make someone trip, such as rocks or tools. Regularly check to see if handrails are loose or broken. Make sure that both sides of any steps have handrails. Any raised decks and porches should have guardrails on the edges. Have any leaves, snow, or ice cleared regularly. Use sand or salt on walking paths during winter. Clean up any  spills in your garage right away. This includes oil or grease spills. What can I do in the bathroom? Use night lights. Install grab bars by the toilet and in the tub and shower. Do not use towel bars as grab bars. Use non-skid mats or decals in the tub or shower. If you need to sit down in the shower, use a plastic, non-slip stool. Keep the floor dry. Clean up any water that spills on the floor as soon as it happens. Remove soap buildup in the tub or shower regularly. Attach bath mats securely with double-sided non-slip rug tape. Do not have throw rugs and other things on the floor that can make you trip. What can I do in the bedroom? Use night lights. Make sure that you have a light by your bed that is easy to reach. Do not use any sheets or blankets that are too big for your bed. They should not hang down onto the floor. Have a firm chair that has side arms. You can use this for support while you get dressed. Do not have throw rugs and other things on the floor that can make you trip. What can I  do in the kitchen? Clean up any spills right away. Avoid walking on wet floors. Keep items that you use a lot in easy-to-reach places. If you need to reach something above you, use a strong step stool that has a grab bar. Keep electrical cords out of the way. Do not use floor polish or wax that makes floors slippery. If you must use wax, use non-skid floor wax. Do not have throw rugs and other things on the floor that can make you trip. What can I do with my stairs? Do not leave any items on the stairs. Make sure that there are handrails on both sides of the stairs and use them. Fix handrails that are broken or loose. Make sure that handrails are as long as the stairways. Check any carpeting to make sure that it is firmly attached to the stairs. Fix any carpet that is loose or worn. Avoid having throw rugs at the top or bottom of the stairs. If you do have throw rugs, attach them to the floor  with carpet tape. Make sure that you have a light switch at the top of the stairs and the bottom of the stairs. If you do not have them, ask someone to add them for you. What else can I do to help prevent falls? Wear shoes that: Do not have high heels. Have rubber bottoms. Are comfortable and fit you well. Are closed at the toe. Do not wear sandals. If you use a stepladder: Make sure that it is fully opened. Do not climb a closed stepladder. Make sure that both sides of the stepladder are locked into place. Ask someone to hold it for you, if possible. Clearly mark and make sure that you can see: Any grab bars or handrails. First and last steps. Where the edge of each step is. Use tools that help you move around (mobility aids) if they are needed. These include: Canes. Walkers. Scooters. Crutches. Turn on the lights when you go into a dark area. Replace any light bulbs as soon as they burn out. Set up your furniture so you have a clear path. Avoid moving your furniture around. If any of your floors are uneven, fix them. If there are any pets around you, be aware of where they are. Review your medicines with your doctor. Some medicines can make you feel dizzy. This can increase your chance of falling. Ask your doctor what other things that you can do to help prevent falls. This information is not intended to replace advice given to you by your health care provider. Make sure you discuss any questions you have with your health care provider. Document Released: 03/12/2009 Document Revised: 10/22/2015 Document Reviewed: 06/20/2014 Elsevier Interactive Patient Education  2017 [employer].

## 2022-11-17 NOTE — Progress Notes (Signed)
Subjective:   Wendy Rogers is a 82 y.o. female who presents for Medicare Annual (Subsequent) preventive examination.  Visit Complete: Virtual  I connected with  Wendy Rogers on 11/17/22 by a audio enabled telemedicine application and verified that I am speaking with the correct person using two identifiers.  Patient Location: Home  Provider Location: Home Office  I discussed the limitations of evaluation and management by telemedicine. The patient expressed understanding and agreed to proceed.  Patient Medicare AWV questionnaire was completed by the patient on ; I have confirmed that all information answered by patient is correct and no changes since this date.  Review of Systems     Cardiac Risk Factors include: advanced age (>21men, >69 women);hypertension     Objective:    Today's Vitals   11/17/22 1242  Weight: 145 lb (65.8 kg)  Height: 4\' 10"  (1.473 m)   Body mass index is 30.31 kg/m.     11/17/2022   12:53 PM 11/07/2022    9:54 AM 03/18/2019    7:54 AM 05/18/2017    5:39 PM 05/15/2017   10:10 AM  Advanced Directives  Does Patient Have a Medical Advance Directive? No No No Yes No  Type of Aeronautical engineer of [place];Living will   Does patient want to make changes to medical advance directive?    No - Patient declined   Copy of Healthcare Power of Attorney in Chart?    No - copy requested   Would patient like information on creating a medical advance directive? No - Patient declined   No - Patient declined Yes (ED - Information included in AVS)    Current Medications (verified) Outpatient Encounter Medications as of 11/17/2022  Medication Sig   allopurinol (ZYLOPRIM) 100 MG tablet Take 1 tablet (100 mg total) by mouth daily.   amLODipine (NORVASC) 5 MG tablet TAKE 1 AND 1/2 TABLETS(7.5 MG) BY MOUTH DAILY   gabapentin (NEURONTIN) 100 MG capsule Take 3 capsules (300 mg total) by mouth 2 (two) times daily. (Patient taking  differently: Take 300 mg by mouth 2 (two) times daily. Two capsules twice daily)   losartan (COZAAR) 50 MG tablet TAKE 1 TABLET(50 MG) BY MOUTH DAILY   vitamin B-12 (CYANOCOBALAMIN) 1000 MCG tablet Take 1 tablet (1,000 mcg total) by mouth daily.   No facility-administered encounter medications on file as of 11/17/2022.    Allergies (verified) Penicillins   History: Past Medical History:  Diagnosis Date   Diabetes mellitus without complication (HCC)    Hypertension    Past Surgical History:  Procedure Laterality Date   ABDOMINAL HYSTERECTOMY     fibroids   CARPAL TUNNEL RELEASE Bilateral    CHOLECYSTECTOMY     Family History  Problem Relation Age of Onset   Diabetes Mother    Hypertension Father    Pancreatic cancer Sister    Social History   Socioeconomic History   Marital status: Widowed    Spouse name: Not on file   Number of children: Not on file   Years of education: Not on file   Highest education level: Not on file  Occupational History   Not on file  Tobacco Use   Smoking status: Never   Smokeless tobacco: Never  Substance and Sexual Activity   Alcohol use: Yes    Comment: occasional, holidays   Drug use: No   Sexual activity: Not on file  Other Topics Concern   Not on file  Social  History Narrative   She walks her dog routinely.    Previously worked as a Tree surgeon.   Right handed.   One level with son.   Caffeine - soda 1 bottle/day   Education - 2 years college   Social Determinants of Health   Financial Resource Strain: Low Risk  (11/17/2022)   Overall Financial Resource Strain (CARDIA)    Difficulty of Paying Living Expenses: Not hard at all  Food Insecurity: No Food Insecurity (11/17/2022)   Hunger Vital Sign    Worried About Running Out of Food in the Last Year: Never true    Ran Out of Food in the Last Year: Never true  Transportation Needs: No Transportation Needs (11/17/2022)   PRAPARE - Administrator, Civil Service  (Medical): No    Lack of Transportation (Non-Medical): No  Physical Activity: Insufficiently Active (11/17/2022)   Exercise Vital Sign    Days of Exercise per Week: 7 days    Minutes of Exercise per Session: 20 min  Stress: No Stress Concern Present (11/17/2022)   [employer] of Occupational Health - Occupational Stress Questionnaire    Feeling of Stress : Not at all  Social Connections: Moderately Integrated (11/17/2022)   Social Connection and Isolation Panel [NHANES]    Frequency of Communication with Friends and Family: More than three times a week    Frequency of Social Gatherings with Friends and Family: More than three times a week    Attends Religious Services: More than 4 times per year    Active Member of [employer] or Organizations: Yes    Attends Banker Meetings: More than 4 times per year    Marital Status: Widowed    Tobacco Counseling Counseling given: Not Answered   Clinical Intake:  Pre-visit preparation completed: No Activities of Daily Living    11/17/2022   12:51 PM  In your present state of health, do you have any difficulty performing the following activities:  Hearing? 1  Comment Wears hearing aides  Vision? 0  Difficulty concentrating or making decisions? 0  Walking or climbing stairs? 0  Dressing or bathing? 0  Doing errands, shopping? 0  Preparing Food and eating ? N  Using the Toilet? N  In the past six months, have you accidently leaked urine? N  Do you have problems with loss of bowel control? N  Managing your Medications? N  Managing your Finances? N  Housekeeping or managing your Housekeeping? N    Patient Care Team: Pincus Sanes, MD as PCP - General (Internal Medicine) Glendale Chard, DO as Consulting Physician (Neurology)  Indicate any recent Medical Services you may have received from other than [facility] providers in the past year (date may be approximate).     Assessment:   This is a routine wellness examination for  Wendy Rogers.  Hearing/Vision screen Hearing Screening - Comments:: Wears hearing aids Vision Screening - Comments:: Wears rx glasses - up to date with routine eye exams with  Dr Dione Booze  Dietary issues and exercise activities discussed:     Goals Addressed               This Visit's Progress     Live a good life (pt-stated)         Depression Screen    11/17/2022   12:50 PM 10/14/2022   12:59 PM 04/11/2022    2:15 PM 10/08/2021    2:45 PM 09/18/2020   10:56 AM 03/19/2019    9:50  AM 11/15/2018    9:52 AM  PHQ 2/9 Scores  PHQ - 2 Score 0 0 0 0 0 0 0  PHQ- 9 Score    0 0      Fall Risk    11/17/2022   12:52 PM 11/07/2022    9:54 AM 10/14/2022   12:59 PM 04/11/2022    2:15 PM 10/08/2021    2:45 PM  Fall Risk   Falls in the past year? 0 0 0 0 0  Number falls in past yr: 0 0 0 0 0  Injury with Fall? 0 0 0 0 0  Risk for fall due to : No Fall Risks  No Fall Risks No Fall Risks No Fall Risks  Follow up Falls prevention discussed Falls evaluation completed Falls evaluation completed Falls evaluation completed Falls evaluation completed    MEDICARE RISK AT HOME:  Medicare Risk at Home - 11/17/22 1258     Any stairs in or around the home? No    If so, are there any without handrails? No    Home free of loose throw rugs in walkways, pet beds, electrical cords, etc? Yes    Adequate lighting in your home to reduce risk of falls? Yes    Life alert? No    Use of a cane, walker or w/c? No    Grab bars in the bathroom? Yes    Shower chair or bench in shower? Yes    Elevated toilet seat or a handicapped toilet? No             TIMED UP AND GO:  Was the test performed?  No    Cognitive Function:    05/15/2017   10:45 AM  MMSE - Mini Mental State Exam  Orientation to time 5  Orientation to Place 5  Registration 3  Attention/ Calculation 5  Recall 2  Language- name 2 objects 2  Language- repeat 1  Language- follow 3 step command 3  Language- read & follow direction 1   Write a sentence 1  Copy design 1  Total score 29        11/17/2022   12:54 PM  6CIT Screen  What Year? 0 points  What month? 0 points  What time? 0 points  Count back from 20 0 points  Months in reverse 4 points  Repeat phrase 2 points  Total Score 6 points    Immunizations Immunization History  Administered Date(s) Administered   PFIZER(Purple Top)SARS-COV-2 Vaccination 08/03/2019, 08/24/2019, 03/05/2020    Covid-19 vaccine status: Completed vaccines    Screening Tests Health Maintenance  Topic Date Due   DEXA SCAN  Never done   FOOT EXAM  04/14/2020   COVID-19 Vaccine (4 - 2023-24 season) 12/03/2022 (Originally 01/28/2022)   Pneumonia Vaccine 31+ Years old (1 of 1 - PCV) 10/14/2023 (Originally 06/10/2005)   INFLUENZA VACCINE  12/29/2022   Diabetic kidney evaluation - Urine ACR  04/12/2023   HEMOGLOBIN A1C  04/16/2023   OPHTHALMOLOGY EXAM  04/20/2023   Diabetic kidney evaluation - eGFR measurement  10/14/2023   Medicare Annual Wellness (AWV)  11/17/2023   HPV VACCINES  Aged Out   DTaP/Tdap/Td  Discontinued   Zoster Vaccines- Shingrix  Discontinued    Health Maintenance  Health Maintenance Due  Topic Date Due   DEXA SCAN  Never done   FOOT EXAM  04/14/2020    Colorectal cancer screening: No longer required.   Mammogram status: No longer required due to Age.  Bone Density status: Ordered 10/14/22. Pt provided with contact info and advised to call to schedule appt.  Lung Cancer Screening: (Low Dose CT Chest recommended if Age 54-80 years, 20 pack-year currently smoking OR have quit w/in 15years.) does not qualify.     Additional Screening:  Hepatitis C Screening: does not qualify; Completed   Vision Screening: Recommended annual ophthalmology exams for early detection of glaucoma and other disorders of the eye. Is the patient up to date with their annual eye exam?  Yes  Who is the provider or what is the name of the office in which the patient attends  annual eye exams? Dr Dione Booze If pt is not established with a provider, would they like to be referred to a provider to establish care? No .   Dental Screening: Recommended annual dental exams for proper oral hygiene    Community Resource Referral / Chronic Care Management:  CRR required this visit?  No   CCM required this visit?  No     Plan:     I have personally reviewed and noted the following in the patient's chart:   Medical and social history Use of alcohol, tobacco or illicit drugs  Current medications and supplements including opioid prescriptions. Patient is not currently taking opioid prescriptions. Functional ability and status Nutritional status Physical activity Advanced directives List of other physicians Hospitalizations, surgeries, and ER visits in previous 12 months Vitals Screenings to include cognitive, depression, and falls Referrals and appointments  In addition, I have reviewed and discussed with patient certain preventive protocols, quality metrics, and best practice recommendations. A written personalized care plan for preventive services as well as general preventive health recommendations were provided to patient.     Tillie Rung, LPN   1/61/0960   After Visit Summary: (MyChart) Due to this being a telephonic visit, the after visit summary with patients personalized plan was offered to patient via MyChart   Nurse Notes: None

## 2022-12-13 ENCOUNTER — Ambulatory Visit
Admission: RE | Admit: 2022-12-13 | Discharge: 2022-12-13 | Disposition: A | Discharge status: Home / Self Care | Payer: Medicare HMO | Source: Ambulatory Visit | Attending: Internal Medicine | Admitting: Internal Medicine

## 2022-12-13 DIAGNOSIS — Z1231 Encounter for screening mammogram for malignant neoplasm of breast: Secondary | ICD-10-CM

## 2023-04-09 ENCOUNTER — Encounter: Payer: Self-pay | Admitting: Internal Medicine

## 2023-04-09 NOTE — Progress Notes (Unsigned)
Subjective:    Patient ID: Wendy Rogers, female    DOB: [birth year], 82 y.o.   MRN: [id number]     HPI Wendy Rogers is here for follow up of her chronic medical problems.  Right sided neck pain - yesterday it was bad.  It feels better today.  She put on a topical medication.   Goes to gym 2/week  Medications and allergies reviewed with patient and updated if appropriate.  Current Outpatient Medications on File Prior to Visit  Medication Sig Dispense Refill   allopurinol (ZYLOPRIM) 100 MG tablet Take 1 tablet (100 mg total) by mouth daily. 30 tablet 6   amLODipine (NORVASC) 5 MG tablet TAKE 1 AND 1/2 TABLETS(7.5 MG) BY MOUTH DAILY 135 tablet 1   gabapentin (NEURONTIN) 100 MG capsule Take 3 capsules (300 mg total) by mouth 2 (two) times daily. (Patient taking differently: Take 300 mg by mouth 2 (two) times daily. Two capsules twice daily) 270 capsule 5   losartan (COZAAR) 50 MG tablet TAKE 1 TABLET(50 MG) BY MOUTH DAILY 90 tablet 1   vitamin B-12 (CYANOCOBALAMIN) 1000 MCG tablet Take 1 tablet (1,000 mcg total) by mouth daily.     No current facility-administered medications on file prior to visit.     Review of Systems  Constitutional:  Negative for fever.  Respiratory:  Negative for cough, shortness of breath and wheezing.   Cardiovascular:  Negative for chest pain, palpitations and leg swelling.  Neurological:  Negative for dizziness, light-headedness and headaches.       Objective:   Vitals:   04/10/23 1302  BP: (!) 146/68  Pulse: 97  Temp: 97.8 F (36.6 C)  SpO2: 95%   BP Readings from Last 3 Encounters:  04/10/23 (!) 146/68  11/07/22 (!) 150/73  10/14/22 136/74   Wt Readings from Last 3 Encounters:  04/10/23 145 lb (65.8 kg)  11/17/22 145 lb (65.8 kg)  11/07/22 145 lb (65.8 kg)   Body mass index is 30.31 kg/m.    Physical Exam Constitutional:      General: She is not in acute distress.    Appearance: Normal appearance.  HENT:     Head:  Normocephalic and atraumatic.  Eyes:     Conjunctiva/sclera: Conjunctivae normal.  Cardiovascular:     Rate and Rhythm: Normal rate and regular rhythm.     Heart sounds: Normal heart sounds.  Pulmonary:     Effort: Pulmonary effort is normal. No respiratory distress.     Breath sounds: Normal breath sounds. No wheezing.  Musculoskeletal:     Cervical back: Neck supple.     Right lower leg: No edema.     Left lower leg: Edema (mild) present.  Lymphadenopathy:     Cervical: No cervical adenopathy.  Skin:    General: Skin is warm and dry.     Findings: No rash.  Neurological:     Mental Status: She is alert. Mental status is at baseline.  Psychiatric:        Mood and Affect: Mood normal.        Behavior: Behavior normal.        Lab Results  Component Value Date   WBC 4.4 10/14/2022   HGB 11.3 (L) 10/14/2022   HCT 34.2 (L) 10/14/2022   PLT 293.0 10/14/2022   GLUCOSE 102 (H) 10/14/2022   CHOL 179 10/14/2022   TRIG 133.0 10/14/2022   HDL 57.70 10/14/2022   LDLDIRECT 114.0 04/11/2022   LDLCALC 95 10/14/2022  ALT 17 10/14/2022   AST 23 10/14/2022   NA 143 10/14/2022   K 3.9 10/14/2022   CL 107 10/14/2022   CREATININE 0.87 10/14/2022   BUN 20 10/14/2022   CO2 29 10/14/2022   TSH 0.22 (L) 10/14/2022   HGBA1C 5.5 10/14/2022   MICROALBUR <0.7 04/11/2022     Assessment & Plan:    See Problem List for Assessment and Plan of chronic medical problems.

## 2023-04-09 NOTE — Patient Instructions (Signed)
      Blood work was ordered.   The lab is on the first floor.    Medications changes include :   increase losartan to 100 mg daily      Return in about 6 months (around 10/08/2023) for Physical Exam.

## 2023-04-10 ENCOUNTER — Ambulatory Visit (INDEPENDENT_AMBULATORY_CARE_PROVIDER_SITE_OTHER): Payer: Medicare HMO | Admitting: Internal Medicine

## 2023-04-10 VITALS — BP 140/72 | HR 97 | Temp 97.8°F | Ht <= 58 in | Wt 145.0 lb

## 2023-04-10 DIAGNOSIS — I10 Essential (primary) hypertension: Secondary | ICD-10-CM | POA: Diagnosis not present

## 2023-04-10 DIAGNOSIS — M109 Gout, unspecified: Secondary | ICD-10-CM | POA: Diagnosis not present

## 2023-04-10 DIAGNOSIS — E0590 Thyrotoxicosis, unspecified without thyrotoxic crisis or storm: Secondary | ICD-10-CM | POA: Diagnosis not present

## 2023-04-10 DIAGNOSIS — R7989 Other specified abnormal findings of blood chemistry: Secondary | ICD-10-CM

## 2023-04-10 DIAGNOSIS — E1142 Type 2 diabetes mellitus with diabetic polyneuropathy: Secondary | ICD-10-CM | POA: Diagnosis not present

## 2023-04-10 DIAGNOSIS — E782 Mixed hyperlipidemia: Secondary | ICD-10-CM | POA: Diagnosis not present

## 2023-04-10 LAB — COMPREHENSIVE METABOLIC PANEL
ALT: 17 U/L (ref 0–35)
AST: 24 U/L (ref 0–37)
Albumin: 4.2 g/dL (ref 3.5–5.2)
Alkaline Phosphatase: 101 U/L (ref 39–117)
BUN: 27 mg/dL — ABNORMAL HIGH (ref 6–23)
CO2: 26 meq/L (ref 19–32)
Calcium: 9.8 mg/dL (ref 8.4–10.5)
Chloride: 106 meq/L (ref 96–112)
Creatinine, Ser: 1.03 mg/dL (ref 0.40–1.20)
GFR: 50.52 mL/min — ABNORMAL LOW (ref >60.00)
Glucose, Bld: 99 mg/dL (ref 70–99)
Potassium: 3.8 meq/L (ref 3.5–5.1)
Sodium: 141 meq/L (ref 135–145)
Total Bilirubin: 0.5 mg/dL (ref 0.2–1.2)
Total Protein: 7.4 g/dL (ref 6.0–8.3)

## 2023-04-10 LAB — LIPID PANEL
Cholesterol: 193 mg/dL (ref 0–200)
HDL: 73.1 mg/dL (ref >39.00)
LDL Cholesterol: 97 mg/dL (ref 0–99)
NonHDL: 119.63
Total CHOL/HDL Ratio: 3
Triglycerides: 115 mg/dL (ref 0.0–149.0)
VLDL: 23 mg/dL (ref 0.0–40.0)

## 2023-04-10 LAB — TSH: TSH: 0.32 u[IU]/mL — ABNORMAL LOW (ref 0.35–5.50)

## 2023-04-10 LAB — MICROALBUMIN / CREATININE URINE RATIO
Creatinine,U: 113.4 mg/dL
Microalb Creat Ratio: 6.6 mg/g (ref 0.0–30.0)
Microalb, Ur: 7.5 mg/dL — ABNORMAL HIGH (ref 0.0–1.9)

## 2023-04-10 LAB — T3, FREE: T3, Free: 3.2 pg/mL (ref 2.3–4.2)

## 2023-04-10 LAB — VITAMIN B12: Vitamin B-12: 327 pg/mL (ref 211–911)

## 2023-04-10 LAB — T4, FREE: Free T4: 0.81 ng/dL (ref 0.60–1.60)

## 2023-04-10 LAB — HEMOGLOBIN A1C: Hgb A1c MFr Bld: 5.5 % (ref 4.6–6.5)

## 2023-04-10 MED ORDER — AMLODIPINE BESYLATE 5 MG PO TABS: ORAL_TABLET | ORAL | 1 refills | Status: DC

## 2023-04-10 MED ORDER — GABAPENTIN 100 MG PO CAPS: ORAL_CAPSULE | ORAL | 1 refills | Status: DC

## 2023-04-10 MED ORDER — LOSARTAN POTASSIUM 100 MG PO TABS: 100.0000 mg | ORAL_TABLET | Freq: Every day | ORAL | 2 refills | Status: DC

## 2023-04-10 NOTE — Assessment & Plan Note (Addendum)
Chronic BP not well controlled Continue amlodipine 7.5 mg daily, increase losartan to 100 mg daily cmp

## 2023-04-10 NOTE — Assessment & Plan Note (Signed)
Chronic Continue B12 supplementation Check b12 level

## 2023-04-10 NOTE — Assessment & Plan Note (Signed)
Chronic Clinically euthyroid TSH

## 2023-04-10 NOTE — Assessment & Plan Note (Addendum)
Chronic Never officially diagnosed with neuropathy-presumed diagnosis Controlled Continue  gabapentin 200 mg am, 100 mg pm

## 2023-04-10 NOTE — Assessment & Plan Note (Signed)
Chronic No recent flares Check uric acid level Continue allopurinol 100 mg daily Continue colchicine as needed for flares

## 2023-04-10 NOTE — Assessment & Plan Note (Signed)
Chronic Check lipid panel  Deferred statin Continue lifestyle Regular exercise and healthy diet encouraged

## 2023-04-10 NOTE — Assessment & Plan Note (Signed)
Chronic  Lab Results  Component Value Date   HGBA1C 5.5 10/14/2022   Sugars well controlled Check A1c, urine microalbumin today Continue lifestyle control Stressed regular exercise, diabetic diet

## 2023-04-19 ENCOUNTER — Telehealth: Payer: Self-pay | Admitting: Internal Medicine

## 2023-04-19 NOTE — Telephone Encounter (Signed)
Pt returned call about labwork, but CMA was unavailable.  Please call pt asap to review most recent blood work. [phone]

## 2023-04-20 NOTE — Telephone Encounter (Signed)
Results given to patient today. 

## 2023-05-03 DIAGNOSIS — D3131 Benign neoplasm of right choroid: Secondary | ICD-10-CM | POA: Diagnosis not present

## 2023-05-03 DIAGNOSIS — Z961 Presence of intraocular lens: Secondary | ICD-10-CM | POA: Diagnosis not present

## 2023-05-03 DIAGNOSIS — H04123 Dry eye syndrome of bilateral lacrimal glands: Secondary | ICD-10-CM | POA: Diagnosis not present

## 2023-05-03 DIAGNOSIS — E119 Type 2 diabetes mellitus without complications: Secondary | ICD-10-CM | POA: Diagnosis not present

## 2023-05-03 DIAGNOSIS — H26493 Other secondary cataract, bilateral: Secondary | ICD-10-CM | POA: Diagnosis not present

## 2023-05-03 LAB — HM DIABETES EYE EXAM

## 2023-05-19 ENCOUNTER — Ambulatory Visit
Admission: EM | Admit: 2023-05-19 | Discharge: 2023-05-19 | Disposition: A | Discharge status: Home / Self Care | Payer: Medicare HMO | Attending: Family Medicine | Admitting: Family Medicine

## 2023-05-19 ENCOUNTER — Ambulatory Visit (INDEPENDENT_AMBULATORY_CARE_PROVIDER_SITE_OTHER): Payer: Medicare HMO

## 2023-05-19 ENCOUNTER — Other Ambulatory Visit: Payer: Self-pay

## 2023-05-19 VITALS — BP 142/74 | HR 88 | Temp 97.7°F | Resp 18

## 2023-05-19 DIAGNOSIS — M109 Gout, unspecified: Secondary | ICD-10-CM

## 2023-05-19 DIAGNOSIS — M19072 Primary osteoarthritis, left ankle and foot: Secondary | ICD-10-CM | POA: Diagnosis not present

## 2023-05-19 DIAGNOSIS — M7989 Other specified soft tissue disorders: Secondary | ICD-10-CM | POA: Diagnosis not present

## 2023-05-19 DIAGNOSIS — M25572 Pain in left ankle and joints of left foot: Secondary | ICD-10-CM

## 2023-05-19 DIAGNOSIS — M7732 Calcaneal spur, left foot: Secondary | ICD-10-CM | POA: Diagnosis not present

## 2023-05-19 MED ORDER — COLCHICINE 0.6 MG PO TABS: 0.6000 mg | ORAL_TABLET | Freq: Every day | ORAL | 0 refills | Status: DC

## 2023-05-19 MED ORDER — METHYLPREDNISOLONE 4 MG PO TBPK: ORAL_TABLET | ORAL | 0 refills | Status: DC

## 2023-05-19 NOTE — Discharge Instructions (Addendum)
You were seen today for ankle pain.  Your xray is normal, other than showing arthritis.  I think this is related to gout.  I will treat with a course of prednisone, and few doses of colchicine. You can stop the colchicine if your pain improves.  You should continue the allopurinol as directed.  Please follow up with your primary care provider if not improving.

## 2023-05-19 NOTE — ED Triage Notes (Signed)
Pt reports L ankle pain x2 weeks. No known injury but reports feeling pain coming home from the gym. Swelling and limited ROM noted to area. Pt reports using a cane for the last two weeks d/t her ankle. Notes hx of arthritis and gout.

## 2023-05-19 NOTE — ED Provider Notes (Signed)
[facility]    CSN: [id number] Arrival date & time: 05/19/23  0847      History   Chief Complaint Chief Complaint  Patient presents with   Ankle Pain    Entered by patient    HPI Wendy Rogers is a 82 y.o. female.    Ankle Pain  Patient is here for left ankle pain and swelling.  She does have h/o gout.  No known injury at this time.  She had pain about 2 weeks ago, mild, and would come/go at the left ankle.  Several days ago the pain worsened considerably, and was worse.  This has continued.  The left ankle is swollen, warm and red.  She states the swelling is into the foot as well.  The swelling in foot is better, but the ankle is still swollen and quite painful.        Past Medical History:  Diagnosis Date   Diabetes mellitus without complication (HCC)    Hypertension     Patient Active Problem List   Diagnosis Date Noted   Hypokalemia 10/14/2022   Anemia 10/14/2022   Left shoulder pain 01/24/2020   Gout 05/01/2019   Arthralgia 05/01/2019   Hyperlipidemia 08/15/2018   Ringing in ear, left 06/22/2017   Subclinical hyperthyroidism 06/22/2017   Skin abnormalities 05/15/2017   Diabetic polyneuropathy (HCC) 07/04/2016   Foot pain, bilateral 07/04/2016   Low vitamin B12 level 07/04/2016   Essential hypertension, benign 12/29/2015   Diabetes (HCC) 12/29/2015   Dry eyes 12/29/2015    Past Surgical History:  Procedure Laterality Date   ABDOMINAL HYSTERECTOMY     fibroids   CARPAL TUNNEL RELEASE Bilateral    CHOLECYSTECTOMY      OB History   No obstetric history on file.      Home Medications    Prior to Admission medications   Medication Sig Start Date End Date Taking? Authorizing Provider  allopurinol (ZYLOPRIM) 100 MG tablet Take 1 tablet (100 mg total) by mouth daily. 04/30/22   Pincus Sanes, MD  amLODipine (NORVASC) 5 MG tablet TAKE 1 AND 1/2 TABLETS(7.5 MG) BY MOUTH DAILY 04/10/23   Pincus Sanes, MD  gabapentin  (NEURONTIN) 100 MG capsule Take 200 mg in morning and take 100 mg at night 04/10/23   Pincus Sanes, MD  losartan (COZAAR) 100 MG tablet Take 1 tablet (100 mg total) by mouth daily. 04/10/23   Pincus Sanes, MD  vitamin B-12 (CYANOCOBALAMIN) 1000 MCG tablet Take 1 tablet (1,000 mcg total) by mouth daily. 08/04/16   Pincus Sanes, MD    Family History Family History  Problem Relation Age of Onset   Diabetes Mother    Hypertension Father    Pancreatic cancer Sister     Social History Social History   Tobacco Use   Smoking status: Never   Smokeless tobacco: Never  Vaping Use   Vaping status: Never Used  Substance Use Topics   Alcohol use: Yes    Comment: occasional, holidays   Drug use: No     Allergies   Penicillins   Review of Systems Review of Systems  Constitutional: Negative.   HENT: Negative.    Respiratory: Negative.    Cardiovascular: Negative.   Gastrointestinal: Negative.   Musculoskeletal:  Positive for gait problem and joint swelling.     Physical Exam Triage Vital Signs ED Triage Vitals  Encounter Vitals Group     BP 05/19/23 0908 (!) 142/74  Systolic BP Percentile --      Diastolic BP Percentile --      Pulse Rate 05/19/23 0908 88     Resp 05/19/23 0908 18     Temp 05/19/23 0908 97.7 F (36.5 C)     Temp Source 05/19/23 0908 Oral     SpO2 05/19/23 0908 95 %     Weight --      Height --      Head Circumference --      Peak Flow --      Pain Score 05/19/23 0909 5     Pain Loc --      Pain Education --      Exclude from Growth Chart --    No data found.  Updated Vital Signs BP (!) 142/74 (BP Location: Right Arm)   Pulse 88   Temp 97.7 F (36.5 C) (Oral)   Resp 18   SpO2 95%   Visual Acuity Right Eye Distance:   Left Eye Distance:   Bilateral Distance:    Right Eye Near:   Left Eye Near:    Bilateral Near:     Physical Exam Constitutional:      Appearance: Normal appearance.  Musculoskeletal:     Comments: There is  swelling to the left lateral malleolus;  the ankle is warm, erythematous;  TTP to the lateral ankle;   Swelling to the top of the foot, but no warmth, nontender  Neurological:     General: No focal deficit present.     Mental Status: She is alert.  Psychiatric:        Mood and Affect: Mood normal.      UC Treatments / Results  Labs (all labs ordered are listed, but only abnormal results are displayed) Labs Reviewed - No data to display  EKG   Radiology DG Ankle Complete Left Result Date: 05/19/2023 CLINICAL DATA:  Left ankle swelling, limited range of motion, symptoms for 2 weeks EXAM: LEFT ANKLE COMPLETE - 3+ VIEW COMPARISON:  None Available. FINDINGS: Frontal, oblique, and lateral views of the left ankle are obtained on a total of 5 images. No acute fracture, subluxation, or dislocation. There is mild osteoarthritis throughout the ankle, hindfoot, and midfoot. Prominent superior and inferior calcaneal spurs. Diffuse soft tissue edema throughout the left lower leg and ankle. IMPRESSION: 1. Diffuse soft tissue swelling. 2. No acute fracture. 3. Diffuse osteoarthritis throughout the ankle and hindfoot. Electronically Signed   By: Sharlet Salina M.D.   On: 05/19/2023 09:29    Procedures Procedures (including critical care time)  Medications Ordered in UC Medications - No data to display  Initial Impression / Assessment and Plan / UC Course  I have reviewed the triage vital signs and the nursing notes.  Pertinent labs & imaging results that were available during my care of the patient were reviewed by me and considered in my medical decision making (see chart for details).   Final Clinical Impressions(s) / UC Diagnoses   Final diagnoses:  Acute left ankle pain  Acute gout of left ankle, unspecified cause     Discharge Instructions      You were seen today for ankle pain.  Your xray is normal, other than showing arthritis.  I think this is related to gout.  I will treat  with a course of prednisone, and few doses of colchicine. You can stop the colchicine if your pain improves.  You should continue the allopurinol as directed.  Please follow up  with your primary care provider if not improving.     ED Prescriptions     Medication Sig Dispense Auth. Provider   methylPREDNISolone (MEDROL DOSEPAK) 4 MG TBPK tablet Take as directed 1 each Lizett Chowning, MD   colchicine 0.6 MG tablet Take 1 tablet (0.6 mg total) by mouth daily for 5 days. Take 1 tab daily as needed for gout pain 5 tablet Jannifer Franklin, MD      PDMP not reviewed this encounter.   Jannifer Franklin, MD 05/19/23 [id number]

## 2023-06-13 ENCOUNTER — Other Ambulatory Visit: Payer: Self-pay | Admitting: Internal Medicine

## 2023-06-13 DIAGNOSIS — E2839 Other primary ovarian failure: Secondary | ICD-10-CM

## 2023-06-13 DIAGNOSIS — Z13820 Encounter for screening for osteoporosis: Secondary | ICD-10-CM

## 2023-06-15 ENCOUNTER — Other Ambulatory Visit: Payer: Medicare HMO

## 2023-06-26 ENCOUNTER — Telehealth: Payer: Self-pay | Admitting: Internal Medicine

## 2023-06-26 NOTE — Progress Notes (Unsigned)
    Subjective:    Patient ID: Wendy Rogers, female    DOB: 07-Sep-1940, 83 y.o.   MRN: [id number]      HPI Magaret is here for No chief complaint on file.    ?  Gout-went to ED 12/20 with left ankle pain and swelling.  Had known history of gout.  X-ray of the ankle showed diffuse soft tissue swelling, diffuse osteoarthritis, no fracture.  The ankle is swollen, warm and red.  She had had symptoms for 2 weeks, But got much worse which is why she went to the emergency room.  She was diagnosed with gout and prescribed a Medrol Dosepak and colchicine and was advised to take the colchicine 1 tablet daily for 5 days then 1 tablet daily as needed for gout pain.  Looks like she is not taking allopurinol.  Medications and allergies reviewed with patient and updated if appropriate.  Current Outpatient Medications on File Prior to Visit  Medication Sig Dispense Refill   allopurinol (ZYLOPRIM) 100 MG tablet Take 1 tablet (100 mg total) by mouth daily. 30 tablet 6   amLODipine (NORVASC) 5 MG tablet TAKE 1 AND 1/2 TABLETS(7.5 MG) BY MOUTH DAILY 135 tablet 1   colchicine 0.6 MG tablet Take 1 tablet (0.6 mg total) by mouth daily for 5 days. Take 1 tab daily as needed for gout pain 5 tablet 0   gabapentin (NEURONTIN) 100 MG capsule Take 200 mg in morning and take 100 mg at night 90 capsule 1   losartan (COZAAR) 100 MG tablet Take 1 tablet (100 mg total) by mouth daily. 90 tablet 2   methylPREDNISolone (MEDROL DOSEPAK) 4 MG TBPK tablet Take as directed 1 each 0   vitamin B-12 (CYANOCOBALAMIN) 1000 MCG tablet Take 1 tablet (1,000 mcg total) by mouth daily.     No current facility-administered medications on file prior to visit.    Review of Systems     Objective:  There were no vitals filed for this visit. BP Readings from Last 3 Encounters:  05/19/23 (!) 142/74  04/10/23 (!) 140/72  11/07/22 (!) 150/73   Wt Readings from Last 3 Encounters:  04/10/23 145 lb (65.8 kg)  11/17/22 145 lb  (65.8 kg)  11/07/22 145 lb (65.8 kg)   There is no height or weight on file to calculate BMI.    Physical Exam         Assessment & Plan:    See Problem List for Assessment and Plan of chronic medical problems.

## 2023-06-26 NOTE — Patient Instructions (Incomplete)
      Blood work was ordered.       Medications changes include :   None    A referral was ordered and someone will call you to schedule an appointment.     No follow-ups on file.

## 2023-06-26 NOTE — Telephone Encounter (Signed)
Patient has gout really bad she went to urgent care and got medication for it and now patient is back in pain

## 2023-06-27 ENCOUNTER — Ambulatory Visit: Payer: Medicare HMO | Admitting: Internal Medicine

## 2023-06-27 ENCOUNTER — Encounter: Payer: Self-pay | Admitting: Internal Medicine

## 2023-06-27 VITALS — BP 126/82 | HR 97 | Temp 98.0°F | Ht <= 58 in | Wt 145.0 lb

## 2023-06-27 DIAGNOSIS — M109 Gout, unspecified: Secondary | ICD-10-CM | POA: Diagnosis not present

## 2023-06-27 DIAGNOSIS — M79622 Pain in left upper arm: Secondary | ICD-10-CM

## 2023-06-27 MED ORDER — ALLOPURINOL 100 MG PO TABS: 200.0000 mg | ORAL_TABLET | Freq: Every day | ORAL | 1 refills | Status: DC

## 2023-06-27 MED ORDER — METHYLPREDNISOLONE 4 MG PO TBPK: ORAL_TABLET | ORAL | 0 refills | Status: DC

## 2023-06-27 NOTE — Assessment & Plan Note (Signed)
Acute Has been taking allopurinol 100 mg daily Had a flare around 12/20-seen in ED and was treated with Medrol Dosepak and colchicine-this did resolve the symptoms Symptoms restarted New Year's Day after having a cup of egg nog with alcohol Symptoms have worsened since then Wendy Rogers has increased swelling and left lower extremity swelling Medrol Dosepak Increase allopurinol to 200 mg daily Hold colchicine for now Advised that if her symptoms are not improving or recur she needs to come back in so we can evaluate further

## 2023-06-27 NOTE — Assessment & Plan Note (Signed)
Acute Has been experiencing left upper arm near the shoulder pain since having to push herself up from the chair because of her gout She did injure her shoulder a little while ago was not sure if it was related to that or not Will bicep tendinitis versus other She deferred seeing sports medicine at this time, but if her pain does not improve she will let me know

## 2023-08-14 ENCOUNTER — Other Ambulatory Visit: Payer: Self-pay | Admitting: Internal Medicine

## 2023-10-08 ENCOUNTER — Encounter: Payer: Self-pay | Admitting: Internal Medicine

## 2023-10-08 NOTE — Patient Instructions (Addendum)

## 2023-10-08 NOTE — Progress Notes (Unsigned)
 Subjective:    Patient ID: Wendy Rogers, female    DOB: [birth year], 83 y.o.   MRN: [id number]      HPI Carshena is here for a Physical exam and her chronic medical problems.   Last urine microalbumin was incorrect - will recheck today   Needs statin - ct cac?  Medications and allergies reviewed with patient and updated if appropriate.  Current Outpatient Medications on File Prior to Visit  Medication Sig Dispense Refill   allopurinol  (ZYLOPRIM ) 100 MG tablet Take 2 tablets (200 mg total) by mouth daily. 180 tablet 1   amLODipine  (NORVASC ) 5 MG tablet TAKE 1 AND 1/2 TABLETS(7.5 MG) BY MOUTH DAILY 135 tablet 1   colchicine  0.6 MG tablet Take 1 tablet (0.6 mg total) by mouth daily for 5 days. Take 1 tab daily as needed for gout pain 5 tablet 0   gabapentin  (NEURONTIN ) 100 MG capsule TAKE 200 MG CAPSULE BY MOUTH EVERY MORNING AND 100 MG CAPSULE AT NIGHT 90 capsule 1   losartan  (COZAAR ) 100 MG tablet Take 1 tablet (100 mg total) by mouth daily. 90 tablet 2   methylPREDNISolone  (MEDROL  DOSEPAK) 4 MG TBPK tablet Take as directed 1 each 0   vitamin B-12 (CYANOCOBALAMIN ) 1000 MCG tablet Take 1 tablet (1,000 mcg total) by mouth daily.     No current facility-administered medications on file prior to visit.    Review of Systems     Objective:  There were no vitals filed for this visit. There were no vitals filed for this visit. There is no height or weight on file to calculate BMI.  BP Readings from Last 3 Encounters:  06/27/23 126/82  05/19/23 (!) 142/74  04/10/23 (!) 140/72    Wt Readings from Last 3 Encounters:  06/27/23 145 lb (65.8 kg)  04/10/23 145 lb (65.8 kg)  11/17/22 145 lb (65.8 kg)       Physical Exam Constitutional: She appears well-developed and well-nourished. No distress.  HENT:  Head: Normocephalic and atraumatic.  Right Ear: External ear normal. Normal ear canal and TM Left Ear: External ear normal.  Normal ear canal and TM Mouth/Throat:  Oropharynx is clear and moist.  Eyes: Conjunctivae normal.  Neck: Neck supple. No tracheal deviation present. No thyromegaly present.  No carotid bruit  Cardiovascular: Normal rate, regular rhythm and normal heart sounds.   No murmur heard.  No edema. Pulmonary/Chest: Effort normal and breath sounds normal. No respiratory distress. She has no wheezes. She has no rales.  Breast: deferred   Abdominal: Soft. She exhibits no distension. There is no tenderness.  Lymphadenopathy: She has no cervical adenopathy.  Skin: Skin is warm and dry. She is not diaphoretic.  Psychiatric: She has a normal mood and affect. Her behavior is normal.     Lab Results  Component Value Date   WBC 4.4 10/14/2022   HGB 11.3 (L) 10/14/2022   HCT 34.2 (L) 10/14/2022   PLT 293.0 10/14/2022   GLUCOSE 99 04/10/2023   CHOL 193 04/10/2023   TRIG 115.0 04/10/2023   HDL 73.10 04/10/2023   LDLDIRECT 114.0 04/11/2022   LDLCALC 97 04/10/2023   ALT 17 04/10/2023   AST 24 04/10/2023   NA 141 04/10/2023   K 3.8 04/10/2023   CL 106 04/10/2023   CREATININE 1.03 04/10/2023   BUN 27 (H) 04/10/2023   CO2 26 04/10/2023   TSH 0.32 (L) 04/10/2023   HGBA1C 5.5 04/10/2023   MICROALBUR 7.5 (H) 04/10/2023  Assessment & Plan:   Physical exam: Screening blood work  ordered Exercise   Weight   Substance abuse  none   Reviewed recommended immunizations.   Health Maintenance  Topic Date Due   DEXA SCAN  Never done   FOOT EXAM  04/14/2020   COVID-19 Vaccine (4 - 2024-25 season) 01/29/2023   HEMOGLOBIN A1C  10/08/2023   Medicare Annual Wellness (AWV)  11/17/2023   Pneumonia Vaccine 37+ Years old (1 of 2 - PCV) 10/14/2023 (Originally 06/11/1959)   INFLUENZA VACCINE  12/29/2023   Diabetic kidney evaluation - eGFR measurement  04/09/2024   Diabetic kidney evaluation - Urine ACR  04/09/2024   OPHTHALMOLOGY EXAM  05/02/2024   HPV VACCINES  Aged Out   Meningococcal B Vaccine  Aged Out   DTaP/Tdap/Td   Discontinued   Zoster Vaccines- Shingrix  Discontinued          See Problem List for Assessment and Plan of chronic medical problems.

## 2023-10-09 ENCOUNTER — Ambulatory Visit (INDEPENDENT_AMBULATORY_CARE_PROVIDER_SITE_OTHER): Payer: Medicare HMO | Admitting: Internal Medicine

## 2023-10-09 VITALS — BP 126/72 | HR 78 | Temp 98.7°F | Ht <= 58 in | Wt 141.0 lb

## 2023-10-09 DIAGNOSIS — E782 Mixed hyperlipidemia: Secondary | ICD-10-CM

## 2023-10-09 DIAGNOSIS — M109 Gout, unspecified: Secondary | ICD-10-CM | POA: Diagnosis not present

## 2023-10-09 DIAGNOSIS — Z0000 Encounter for general adult medical examination without abnormal findings: Secondary | ICD-10-CM | POA: Diagnosis not present

## 2023-10-09 DIAGNOSIS — E0590 Thyrotoxicosis, unspecified without thyrotoxic crisis or storm: Secondary | ICD-10-CM | POA: Diagnosis not present

## 2023-10-09 DIAGNOSIS — E876 Hypokalemia: Secondary | ICD-10-CM | POA: Diagnosis not present

## 2023-10-09 DIAGNOSIS — E1142 Type 2 diabetes mellitus with diabetic polyneuropathy: Secondary | ICD-10-CM

## 2023-10-09 DIAGNOSIS — R7989 Other specified abnormal findings of blood chemistry: Secondary | ICD-10-CM

## 2023-10-09 DIAGNOSIS — I10 Essential (primary) hypertension: Secondary | ICD-10-CM | POA: Diagnosis not present

## 2023-10-09 LAB — COMPREHENSIVE METABOLIC PANEL WITH GFR
ALT: 17 U/L (ref 0–35)
AST: 21 U/L (ref 0–37)
Albumin: 4.3 g/dL (ref 3.5–5.2)
Alkaline Phosphatase: 117 U/L (ref 39–117)
BUN: 22 mg/dL (ref 6–23)
CO2: 29 meq/L (ref 19–32)
Calcium: 10.3 mg/dL (ref 8.4–10.5)
Chloride: 107 meq/L (ref 96–112)
Creatinine, Ser: 0.95 mg/dL (ref 0.40–1.20)
GFR: 55.48 mL/min — ABNORMAL LOW (ref >60.00)
Glucose, Bld: 100 mg/dL — ABNORMAL HIGH (ref 70–99)
Potassium: 4.4 meq/L (ref 3.5–5.1)
Sodium: 143 meq/L (ref 135–145)
Total Bilirubin: 0.5 mg/dL (ref 0.2–1.2)
Total Protein: 7.2 g/dL (ref 6.0–8.3)

## 2023-10-09 LAB — CBC WITH DIFFERENTIAL/PLATELET
Basophils Absolute: 0 10*3/uL (ref 0.0–0.1)
Basophils Relative: 0.5 % (ref 0.0–3.0)
Eosinophils Absolute: 0 10*3/uL (ref 0.0–0.7)
Eosinophils Relative: 0.9 % (ref 0.0–5.0)
HCT: 37.3 % (ref 36.0–46.0)
Hemoglobin: 12 g/dL (ref 12.0–15.0)
Lymphocytes Relative: 49.3 % — ABNORMAL HIGH (ref 12.0–46.0)
Lymphs Abs: 1.9 10*3/uL (ref 0.7–4.0)
MCHC: 32.1 g/dL (ref 30.0–36.0)
MCV: 87.6 fl (ref 78.0–100.0)
Monocytes Absolute: 0.4 10*3/uL (ref 0.1–1.0)
Monocytes Relative: 10.1 % (ref 3.0–12.0)
Neutro Abs: 1.5 10*3/uL (ref 1.4–7.7)
Neutrophils Relative %: 39.2 % — ABNORMAL LOW (ref 43.0–77.0)
Platelets: 274 10*3/uL (ref 150.0–400.0)
RBC: 4.25 Mil/uL (ref 3.87–5.11)
RDW: 15.9 % — ABNORMAL HIGH (ref 11.5–15.5)
WBC: 3.9 10*3/uL — ABNORMAL LOW (ref 4.0–10.5)

## 2023-10-09 LAB — URIC ACID: Uric Acid, Serum: 6.1 mg/dL (ref 2.4–7.0)

## 2023-10-09 LAB — MICROALBUMIN / CREATININE URINE RATIO
Creatinine,U: 51.2 mg/dL
Microalb Creat Ratio: UNDETERMINED mg/g (ref 0.0–30.0)
Microalb, Ur: <0.7 mg/dL

## 2023-10-09 LAB — LIPID PANEL
Cholesterol: 200 mg/dL (ref 0–200)
HDL: 64.4 mg/dL (ref >39.00)
LDL Cholesterol: 105 mg/dL — ABNORMAL HIGH (ref 0–99)
NonHDL: 135.56
Total CHOL/HDL Ratio: 3
Triglycerides: 155 mg/dL — ABNORMAL HIGH (ref 0.0–149.0)
VLDL: 31 mg/dL (ref 0.0–40.0)

## 2023-10-09 LAB — TSH: TSH: 0.34 u[IU]/mL — ABNORMAL LOW (ref 0.35–5.50)

## 2023-10-09 LAB — HEMOGLOBIN A1C: Hgb A1c MFr Bld: 5.6 % (ref 4.6–6.5)

## 2023-10-09 LAB — VITAMIN B12: Vitamin B-12: 331 pg/mL (ref 211–911)

## 2023-10-09 MED ORDER — GABAPENTIN 100 MG PO CAPS
ORAL_CAPSULE | ORAL | 1 refills | Status: DC
Start: 2023-10-09 — End: 2023-10-17

## 2023-10-09 MED ORDER — AMLODIPINE BESYLATE 10 MG PO TABS: 10.0000 mg | ORAL_TABLET | Freq: Every day | ORAL | 1 refills | Status: AC

## 2023-10-09 MED ORDER — VALSARTAN 320 MG PO TABS
320.0000 mg | ORAL_TABLET | Freq: Every day | ORAL | 3 refills | Status: DC
Start: 2023-10-09 — End: 2023-10-17

## 2023-10-09 NOTE — Assessment & Plan Note (Addendum)
 Chronic Occ still intermittent zaps, burning sensation Never officially diagnosed with neuropathy-presumed diagnosis Ran out of gabapentin  -- will refill today gabapentin  200 mg am, 100 mg pm

## 2023-10-09 NOTE — Assessment & Plan Note (Signed)
 Chronic Check lipid panel  Deferred statin Continue lifestyle control Regular exercise and healthy diet encouraged

## 2023-10-09 NOTE — Assessment & Plan Note (Signed)
 Chronic Continue annual allopurinol  200 mg daily No flares since her last visit Check uric acid level

## 2023-10-09 NOTE — Assessment & Plan Note (Signed)
 Chronic Continue B12 supplementation Check b12 level

## 2023-10-09 NOTE — Assessment & Plan Note (Signed)
Chronic °CMP °Continue potassium chloride 20 mEq daily °

## 2023-10-09 NOTE — Assessment & Plan Note (Addendum)
 Chronic BP a little high Continue amlodipine  7.5 mg daily, change losartan  to 100 mg daily to valsartan 320 mg daily Cmp, cbc

## 2023-10-09 NOTE — Assessment & Plan Note (Addendum)
 Chronic TSH intermittently low Clinically euthyroid TSH

## 2023-10-09 NOTE — Assessment & Plan Note (Signed)
 Chronic  Lab Results  Component Value Date   HGBA1C 5.5 04/10/2023   Sugars well controlled Check A1c, urine microalbumin today Continue lifestyle control Stressed regular exercise, diabetic diet

## 2023-10-10 ENCOUNTER — Telehealth: Payer: Self-pay | Admitting: Pharmacy Technician

## 2023-10-10 ENCOUNTER — Other Ambulatory Visit (HOSPITAL_COMMUNITY): Payer: Self-pay

## 2023-10-10 NOTE — Telephone Encounter (Signed)
 Pharmacy Patient Advocate Encounter   Received notification from Onbase that prior authorization for VALSARTAN 320 MG is required/requested.   Insurance verification completed.   The patient is insured through [employer] .   Per test claim: The current 90 day co-pay is, $0.00.  No PA needed at this time. This test claim was processed through [facility]- copay amounts may vary at other pharmacies due to pharmacy/plan contracts, or as the patient moves through the different stages of their insurance plan.       I also contacted [facility] but the store was temporarily closed due to an emergency today 10/10/23

## 2023-10-12 ENCOUNTER — Ambulatory Visit: Payer: Self-pay | Admitting: Internal Medicine

## 2023-10-16 NOTE — Progress Notes (Signed)
 Subjective:    Patient ID: Wendy Rogers, female    DOB: 1941-04-07, 83 y.o.   MRN: [id number]      HPI Carsynn is here for  Chief Complaint  Patient presents with   Cough    Cough, sore throat, congestion and nasal drainage; Started this past Friday     Cold symptoms - symptoms started 4 days ago.  She states decreased appetite, nasal congestion, sore throat, cough with white phlegm and some lightheadedness.  She denies any fevers, shortness of breath, wheezing, chronic pain or headaches.  Not taking anything  Hyperlipidemia - advised low dose crestor .  She is not sure if she wants to add any other medications which she is already taking.  She would rather work on diet first    Medications and allergies reviewed with patient and updated if appropriate.  Current Outpatient Medications on File Prior to Visit  Medication Sig Dispense Refill   allopurinol  (ZYLOPRIM ) 100 MG tablet Take 2 tablets (200 mg total) by mouth daily. 180 tablet 1   amLODipine  (NORVASC ) 10 MG tablet Take 1 tablet (10 mg total) by mouth daily. For your blood pressure 90 tablet 1   gabapentin  (NEURONTIN ) 100 MG capsule TAKE 200 MG CAPSULE BY MOUTH EVERY MORNING AND 100 MG CAPSULE AT NIGHT 270 capsule 1   valsartan  (DIOVAN ) 320 MG tablet Take 1 tablet (320 mg total) by mouth daily. For your blood pressure 90 tablet 3   vitamin B-12 (CYANOCOBALAMIN ) 1000 MCG tablet Take 1 tablet (1,000 mcg total) by mouth daily.     colchicine  0.6 MG tablet Take 1 tablet (0.6 mg total) by mouth daily for 5 days. Take 1 tab daily as needed for gout pain 5 tablet 0   No current facility-administered medications on file prior to visit.    Review of Systems  Constitutional:  Positive for appetite change (decreased). Negative for fever.  HENT:  Positive for congestion (mucus is white), sore throat and voice change. Negative for ear pain, postnasal drip, sinus pressure and sinus pain.   Respiratory:  Positive for cough  (white phlegm). Negative for shortness of breath and wheezing.   Gastrointestinal:  Negative for diarrhea and nausea.  Musculoskeletal:  Positive for neck pain (muscle on sides of neck). Negative for myalgias.  Neurological:  Positive for light-headedness. Negative for dizziness and headaches.       Objective:   Vitals:   10/17/23 0851  BP: 124/62  Pulse: 79  Temp: 98.2 F (36.8 C)  SpO2: 97%   BP Readings from Last 3 Encounters:  10/17/23 124/62  10/09/23 126/72  06/27/23 126/82   Wt Readings from Last 3 Encounters:  10/17/23 142 lb (64.4 kg)  10/09/23 141 lb (64 kg)  06/27/23 145 lb (65.8 kg)   Body mass index is 29.68 kg/m.    Physical Exam Constitutional:      General: She is not in acute distress.    Appearance: Normal appearance. She is not ill-appearing.  HENT:     Head: Normocephalic and atraumatic.     Right Ear: Tympanic membrane, ear canal and external ear normal.     Left Ear: Tympanic membrane, ear canal and external ear normal.     Mouth/Throat:     Mouth: Mucous membranes are moist.     Pharynx: No oropharyngeal exudate or posterior oropharyngeal erythema.  Eyes:     Conjunctiva/sclera: Conjunctivae normal.  Cardiovascular:     Rate and Rhythm: Normal rate and regular rhythm.  Pulmonary:     Effort: Pulmonary effort is normal. No respiratory distress.     Breath sounds: Normal breath sounds. No wheezing or rales.  Musculoskeletal:     Cervical back: Neck supple. No tenderness.  Lymphadenopathy:     Cervical: No cervical adenopathy.  Skin:    General: Skin is warm and dry.  Neurological:     Mental Status: She is alert.            Assessment & Plan:    See Problem List for Assessment and Plan of chronic medical problems.

## 2023-10-17 ENCOUNTER — Ambulatory Visit: Admitting: Neurology

## 2023-10-17 ENCOUNTER — Encounter: Payer: Self-pay | Admitting: Internal Medicine

## 2023-10-17 ENCOUNTER — Ambulatory Visit (INDEPENDENT_AMBULATORY_CARE_PROVIDER_SITE_OTHER): Admitting: Internal Medicine

## 2023-10-17 ENCOUNTER — Encounter: Payer: Self-pay | Admitting: Neurology

## 2023-10-17 VITALS — BP 117/66 | HR 94 | Ht <= 58 in | Wt 142.0 lb

## 2023-10-17 VITALS — BP 124/62 | HR 79 | Temp 98.2°F | Ht <= 58 in | Wt 142.0 lb

## 2023-10-17 DIAGNOSIS — I10 Essential (primary) hypertension: Secondary | ICD-10-CM | POA: Diagnosis not present

## 2023-10-17 DIAGNOSIS — R202 Paresthesia of skin: Secondary | ICD-10-CM | POA: Diagnosis not present

## 2023-10-17 DIAGNOSIS — J069 Acute upper respiratory infection, unspecified: Secondary | ICD-10-CM | POA: Diagnosis not present

## 2023-10-17 DIAGNOSIS — E782 Mixed hyperlipidemia: Secondary | ICD-10-CM

## 2023-10-17 MED ORDER — BENZONATATE 100 MG PO CAPS: 100.0000 mg | ORAL_CAPSULE | Freq: Three times a day (TID) | ORAL | 0 refills | Status: DC | PRN

## 2023-10-17 MED ORDER — GABAPENTIN 100 MG PO CAPS: 200.0000 mg | ORAL_CAPSULE | Freq: Two times a day (BID) | ORAL | 1 refills | Status: AC

## 2023-10-17 MED ORDER — LOSARTAN POTASSIUM 100 MG PO TABS: 100.0000 mg | ORAL_TABLET | Freq: Every day | ORAL | Status: DC

## 2023-10-17 NOTE — Patient Instructions (Addendum)
 You have a viral illness   Take tylenol as needed.  You can take any coricidin products which are over the counter.  Try taking mucinex which helps with the congestion and mucus.    Start tessalon perles for the cough three times day as needed.     Return if symptoms worsen or fail to improve.    Upper Respiratory Infection, Adult An upper respiratory infection (URI) is a common viral infection of the nose, throat, and upper air passages that lead to the lungs. The most common type of URI is the common cold. URIs usually get better on their own, without medical treatment. What are the causes? A URI is caused by a virus. You may catch a virus by: Breathing in droplets from an infected person's cough or sneeze. Touching something that has been exposed to the virus (is contaminated) and then touching your mouth, nose, or eyes. What increases the risk? You are more likely to get a URI if: You are very young or very old. You have close contact with others, such as at work, school, or a health care facility. You smoke. You have long-term (chronic) heart or lung disease. You have a weakened disease-fighting system (immune system). You have nasal allergies or asthma. You are experiencing a lot of stress. You have poor nutrition. What are the signs or symptoms? A URI usually involves some of the following symptoms: Runny or stuffy (congested) nose. Cough. Sneezing. Sore throat. Headache. Fatigue. Fever. Loss of appetite. Pain in your forehead, behind your eyes, and over your cheekbones (sinus pain). Muscle aches. Redness or irritation of the eyes. Pressure in the ears or face. How is this diagnosed? This condition may be diagnosed based on your medical history and symptoms, and a physical exam. Your health care provider may use a swab to take a mucus sample from your nose (nasal swab). This sample can be tested to determine what virus is causing the illness. How is this  treated? URIs usually get better on their own within 7-10 days. Medicines cannot cure URIs, but your health care provider may recommend certain medicines to help relieve symptoms, such as: Over-the-counter cold medicines. Cough suppressants. Coughing is a type of defense against infection that helps to clear the respiratory system, so take these medicines only as recommended by your health care provider. Fever-reducing medicines. Follow these instructions at home: Activity Rest as needed. If you have a fever, stay home from work or school until your fever is gone or until your health care provider says your URI cannot spread to other people (is no longer contagious). Your health care provider may have you wear a face mask to prevent your infection from spreading. Relieving symptoms Gargle with a mixture of salt and water 3-4 times a day or as needed. To make salt water, completely dissolve -1 tsp (3-6 g) of salt in 1 cup (237 mL) of warm water. Use a cool-mist humidifier to add moisture to the air. This can help you breathe more easily. Eating and drinking  Drink enough fluid to keep your urine pale yellow. Eat soups and other clear broths. General instructions  Take over-the-counter and prescription medicines only as told by your health care provider. These include cold medicines, fever reducers, and cough suppressants. Do not use any products that contain nicotine or tobacco. These products include cigarettes, chewing tobacco, and vaping devices, such as e-cigarettes. If you need help quitting, ask your health care provider. Stay away from secondhand  smoke. Stay up to date on all immunizations, including the yearly (annual) flu vaccine. Keep all follow-up visits. This is important. How to prevent the spread of infection to others URIs can be contagious. To prevent the infection from spreading: Wash your hands with soap and water for at least 20 seconds. If soap and water are not  available, use hand sanitizer. Avoid touching your mouth, face, eyes, or nose. Cough or sneeze into a tissue or your sleeve or elbow instead of into your hand or into the air.  Contact a health care provider if: You are getting worse instead of better. You have a fever or chills. Your mucus is brown or red. You have yellow or brown discharge coming from your nose. You have pain in your face, especially when you bend forward. You have swollen neck glands. You have pain while swallowing. You have white areas in the back of your throat. Get help right away if: You have shortness of breath that gets worse. You have severe or persistent: Headache. Ear pain. Sinus pain. Chest pain. You have chronic lung disease along with any of the following: Making high-pitched whistling sounds when you breathe, most often when you breathe out (wheezing). Prolonged cough (more than 14 days). Coughing up blood. A change in your usual mucus. You have a stiff neck. You have changes in your: Vision. Hearing. Thinking. Mood. These symptoms may be an emergency. Get help right away. Call 911. Do not wait to see if the symptoms will go away. Do not drive yourself to the hospital. Summary An upper respiratory infection (URI) is a common infection of the nose, throat, and upper air passages that lead to the lungs. A URI is caused by a virus. URIs usually get better on their own within 7-10 days. Medicines cannot cure URIs, but your health care provider may recommend certain medicines to help relieve symptoms. This information is not intended to replace advice given to you by your health care provider. Make sure you discuss any questions you have with your health care provider. Document Revised: 12/16/2020 Document Reviewed: 12/16/2020 Elsevier Patient Education  2024 [employer].

## 2023-10-17 NOTE — Assessment & Plan Note (Signed)
 Acute Symptoms likely viral in nature Rapid strep and COVID tests are negative No need for an antibiotic Tessalon Perles 3 times daily as needed Can take Coricidin cold products, Tylenol, Mucinex Continue symptomatic treatment with over-the-counter cold medications, Tylenol/ibuprofen Increase rest and fluids Call if symptoms worsen or do not improve

## 2023-10-17 NOTE — Progress Notes (Signed)
 Follow-up Visit   Date: 10/17/2023    Wendy Rogers MRN: [id number] DOB: 04-Jun-1940    Wendy Rogers is a 83 y.o. right-handed female with well-controlled diabetes mellitus, hyperlipidemia, and hypertension returning to the clinic for follow-up of paresthesia.  The patient was accompanied to the clinic by self.  IMPRESSION/PLAN: Bilateral feet paresthesias, worsening.  Possible small fiber neuropathy given that neurological exam, including sensation and reflexes, are intact.  Discussed further testing with NCS/EMG, but opted not to proceed because of pain associated with testing.  I suggested that we can try to control her pain with optimizing her medication.  Recommend increasing gabapentin  to 200mg  twice daily.  Generalized paresthesias of the arms and thighs.  Unclear etiology. No signs of myelopathy.  Vitamin B12 is normal.  Continue to monitor.   Return to clinic in 4 months  --------------------------------------------- History of present illness: She reports having spells of numbness/tingling involving the the feet, legs, and arms.  It occurs about twice per week and lasts 5 minutes. There is no specific triggers.  No imbalance, weakness, neck pain, or falls.   She reports that gabapentin  200mg  twice daily helps.  However, if she skips her evening dose, she tends to feel the numbness/tingling more.    She lives with her son.    UPDATE 10/17/2023:  She reports having worsening tingling involving the soles of her feet. She takes gabapentin  200mg  in the morning and 100mg  at bedtime and pain is generally controlled if she takes it on time.  Pain is worse when she has been on her feet all day. It does not occur daily.  She also reports tingling involving the upper arms and thigh.  No associated weakness of imbalance.   Medications:  Current Outpatient Medications on File Prior to Visit  Medication Sig Dispense Refill   allopurinol  (ZYLOPRIM ) 100 MG tablet Take 2  tablets (200 mg total) by mouth daily. 180 tablet 1   amLODipine  (NORVASC ) 10 MG tablet Take 1 tablet (10 mg total) by mouth daily. For your blood pressure 90 tablet 1   colchicine  0.6 MG tablet Take 1 tablet (0.6 mg total) by mouth daily for 5 days. Take 1 tab daily as needed for gout pain 5 tablet 0   vitamin B-12 (CYANOCOBALAMIN ) 1000 MCG tablet Take 1 tablet (1,000 mcg total) by mouth daily.     No current facility-administered medications on file prior to visit.    Allergies:  Allergies  Allergen Reactions   Penicillins Hives    Vital Signs:  BP 117/66   Pulse 94   Ht 4\' 10"  (1.473 m)   Wt 142 lb (64.4 kg)   SpO2 98%   BMI 29.68 kg/m    Neurological Exam: MENTAL STATUS including orientation to time, place, person, recent and remote memory, attention span and concentration, language, and fund of knowledge is normal.  Speech is not dysarthric.  CRANIAL NERVES:   Pupils equal round and reactive to light.  Normal conjugate, extra-ocular eye movements in all directions of gaze.  No ptosis.  Face is symmetric. Palate elevates symmetrically.  Tongue is midline.  MOTOR:  Motor strength is 5/5 in all extremities.  No atrophy, fasciculations or abnormal movements.  No pronator drift.  Tone is normal.    MSRs:  Reflexes are 2+/4 throughout .  SENSORY:  Intact to vibration and temperature throughout .  COORDINATION/GAIT:  Normal finger-to- nose-finger.  Intact rapid alternating movements bilaterally.  Gait narrow based and stable.  Data:  Lab Results  Component Value Date   VITAMINB12 331 10/09/2023     Thank you for allowing me to participate in patient's care.  If I can answer any additional questions, I would be pleased to do so.    Sincerely,    Yolani Vo K. Lydia Sams, DO

## 2023-10-17 NOTE — Patient Instructions (Signed)
 Start taking gabapentin  200mg  in the morning and 200mg  at bedtime.

## 2023-10-17 NOTE — Assessment & Plan Note (Signed)
 Chronic LDL above goal and she does have diabetes Discussed starting a low-dose statin, but she deferred Will work on her diet

## 2023-10-17 NOTE — Assessment & Plan Note (Signed)
 Chronic BP controlled Continue amlodipine  10 mg daily, losartan  to 100 mg daily

## 2023-11-06 ENCOUNTER — Other Ambulatory Visit: Payer: Self-pay | Admitting: Internal Medicine

## 2023-11-06 DIAGNOSIS — Z1231 Encounter for screening mammogram for malignant neoplasm of breast: Secondary | ICD-10-CM

## 2023-11-23 ENCOUNTER — Ambulatory Visit: Payer: Medicare HMO

## 2023-11-23 VITALS — Ht <= 58 in | Wt 142.0 lb

## 2023-11-23 DIAGNOSIS — Z0000 Encounter for general adult medical examination without abnormal findings: Secondary | ICD-10-CM | POA: Diagnosis not present

## 2023-11-23 NOTE — Patient Instructions (Signed)
 Ms. Wendy Rogers , Thank you for taking time out of your busy schedule to complete your Annual Wellness Visit with me. I enjoyed our conversation and look forward to speaking with you again next year. I, as well as your care team,  appreciate your ongoing commitment to your health goals. Please review the following plan we discussed and let me know if I can assist you in the future. Your Game plan/ To Do List    Follow up Visits: Next Medicare AWV with our clinical staff: 11/28/2024.   Have you seen your provider in the last 6 months (3 months if uncontrolled diabetes)? Yes Next Office Visit with your provider: 04/10/2024  Clinician Recommendations:  Aim for 30 minutes of exercise or brisk walking, 6-8 glasses of water, and 5 servings of fruits and vegetables each day. Keep up the good work.      This is a list of the screening recommended for you and due dates:  Health Maintenance  Topic Date Due   Pneumococcal Vaccine for age over 74 (1 of 2 - PCV) Never done   DEXA scan (bone density measurement)  Never done   COVID-19 Vaccine (4 - 2024-25 season) 01/29/2023   Medicare Annual Wellness Visit  11/17/2023   Flu Shot  12/29/2023   Hemoglobin A1C  04/10/2024   Eye exam for diabetics  05/02/2024   Yearly kidney function blood test for diabetes  10/08/2024   Yearly kidney health urinalysis for diabetes  10/08/2024   Complete foot exam   10/08/2024   Hepatitis B Vaccine  Aged Out   HPV Vaccine  Aged Out   Meningitis B Vaccine  Aged Out   DTaP/Tdap/Td vaccine  Discontinued   Zoster (Shingles) Vaccine  Discontinued    Advanced directives: (Declined) Advance directive discussed with you today. Even though you declined this today, please call our office should you change your mind, and we can give you the proper paperwork for you to fill out. Advance Care Planning is important because it:  [x]  Makes sure you receive the medical care that is consistent with your values, goals, and  preferences  [x]  It provides guidance to your family and loved ones and reduces their decisional burden about whether or not they are making the right decisions based on your wishes.  Follow the link provided in your after visit summary or read over the paperwork we have mailed to you to help you started getting your Advance Directives in place. If you need assistance in completing these, please reach out to us  so that we can help you!  See attachments for Preventive Care and Fall Prevention Tips.

## 2023-11-23 NOTE — Progress Notes (Signed)
 Subjective:   Wendy Rogers is a 83 y.o. who presents for a Medicare Wellness preventive visit.  As a reminder, Annual Wellness Visits don't include a physical exam, and some assessments may be limited, especially if this visit is performed virtually. We may recommend an in-person follow-up visit with your provider if needed.  Visit Complete: Virtual I connected with  Wendy Rogers on 11/23/23 by a audio enabled telemedicine application and verified that I am speaking with the correct person using two identifiers.  Patient Location: Home  Provider Location: Office/Clinic  I discussed the limitations of evaluation and management by telemedicine. The patient expressed understanding and agreed to proceed.  Vital Signs: Because this visit was a virtual/telehealth visit, some criteria may be missing or patient reported. Any vitals not documented were not able to be obtained and vitals that have been documented are patient reported.  VideoDeclined- This patient declined Librarian, academic. Therefore the visit was completed with audio only.  Persons Participating in Visit: Patient.  AWV Questionnaire: No: Patient Medicare AWV questionnaire was not completed prior to this visit.  Cardiac Risk Factors include: advanced age (>70men, >48 women);diabetes mellitus;dyslipidemia;hypertension     Objective:    Today's Vitals   11/23/23 1437  Weight: 142 lb (64.4 kg)  Height: 4' 10 (1.473 m)   Body mass index is 29.68 kg/m.     11/23/2023    2:42 PM 10/17/2023    1:33 PM 11/17/2022   12:53 PM 11/07/2022    9:54 AM 03/18/2019    7:54 AM 05/18/2017    5:39 PM 05/15/2017   10:10 AM  Advanced Directives  Does Patient Have a Medical Advance Directive? No No No No No Yes  No   Type of Careers adviser;Living will   Does patient want to make changes to medical advance directive?      No - Patient declined    Copy of  Healthcare Power of Attorney in Chart?      No - copy requested    Would patient like information on creating a medical advance directive?   No - Patient declined   No - Patient declined  Yes (ED - Information included in AVS)      Data saved with a previous flowsheet row definition    Current Medications (verified) Outpatient Encounter Medications as of 11/23/2023  Medication Sig   allopurinol  (ZYLOPRIM ) 100 MG tablet Take 2 tablets (200 mg total) by mouth daily.   amLODipine  (NORVASC ) 10 MG tablet Take 1 tablet (10 mg total) by mouth daily. For your blood pressure   benzonatate  (TESSALON  PERLES) 100 MG capsule Take 1 capsule (100 mg total) by mouth 3 (three) times daily as needed for cough.   colchicine  0.6 MG tablet Take 1 tablet (0.6 mg total) by mouth daily for 5 days. Take 1 tab daily as needed for gout pain   gabapentin  (NEURONTIN ) 100 MG capsule Take 2 capsules (200 mg total) by mouth 2 (two) times daily.   losartan  (COZAAR ) 100 MG tablet Take 1 tablet (100 mg total) by mouth daily.   vitamin B-12 (CYANOCOBALAMIN ) 1000 MCG tablet Take 1 tablet (1,000 mcg total) by mouth daily.   No facility-administered encounter medications on file as of 11/23/2023.    Allergies (verified) Penicillins   History: Past Medical History:  Diagnosis Date   Diabetes mellitus without complication (HCC)    Hypertension    Past Surgical History:  Procedure Laterality Date   ABDOMINAL HYSTERECTOMY     fibroids   CARPAL TUNNEL RELEASE Bilateral    CHOLECYSTECTOMY     Family History  Problem Relation Age of Onset   Diabetes Mother    Hypertension Father    Pancreatic cancer Sister    Social History   Socioeconomic History   Marital status: Widowed    Spouse name: Not on file   Number of children: Not on file   Years of education: Not on file   Highest education level: Not on file  Occupational History   Occupation: Retired  Tobacco Use   Smoking status: Never   Smokeless tobacco:  Never  Vaping Use   Vaping status: Never Used  Substance and Sexual Activity   Alcohol use: Yes    Comment: occasional, holidays   Drug use: No   Sexual activity: Not on file  Other Topics Concern   Not on file  Social History Narrative   She walks her dog routinely.    Previously worked as a Tree surgeon.   Right handed.   One level with son.   Caffeine - soda 1 bottle/day   Education - 2 years college      Lives with her son/2025 and 1 dog   Social Drivers of Corporate investment banker Strain: Low Risk  (11/23/2023)   Overall Financial Resource Strain (CARDIA)    Difficulty of Paying Living Expenses: Not hard at all  Food Insecurity: No Food Insecurity (11/23/2023)   Hunger Vital Sign    Worried About Running Out of Food in the Last Year: Never true    Ran Out of Food in the Last Year: Never true  Transportation Needs: No Transportation Needs (11/23/2023)   PRAPARE - Administrator, Civil Service (Medical): No    Lack of Transportation (Non-Medical): No  Physical Activity: Sufficiently Active (11/23/2023)   Exercise Vital Sign    Days of Exercise per Week: 7 days    Minutes of Exercise per Session: 60 min  Stress: No Stress Concern Present (11/23/2023)   [employer] of Occupational Health - Occupational Stress Questionnaire    Feeling of Stress: Not at all  Social Connections: Moderately Integrated (11/23/2023)   Social Connection and Isolation Panel    Frequency of Communication with Friends and Family: More than three times a week    Frequency of Social Gatherings with Friends and Family: Never    Attends Religious Services: More than 4 times per year    Active Member of [employer] or Organizations: Yes    Attends Banker Meetings: 1 to 4 times per year    Marital Status: Widowed    Tobacco Counseling Counseling given: Not Answered    Clinical Intake:  Pre-visit preparation completed: Yes  Pain : No/denies pain     BMI - recorded:  29.68 Nutritional Status: BMI 25 -29 Overweight Nutritional Risks: None Diabetes: Yes CBG done?: No Did pt. bring in CBG monitor from home?: No  Lab Results  Component Value Date   HGBA1C 5.6 10/09/2023   HGBA1C 5.5 04/10/2023   HGBA1C 5.5 10/14/2022     How often do you need to have someone help you when you read instructions, pamphlets, or other written materials from your doctor or pharmacy?: 1 - Never  Interpreter Needed?: No  Information entered by :: Betrice Wanat, RMA   Activities of Daily Living     11/23/2023    2:39 PM  In your  present state of health, do you have any difficulty performing the following activities:  Hearing? 1  Comment wears hearing aides  Vision? 0  Difficulty concentrating or making decisions? 0  Walking or climbing stairs? 0  Dressing or bathing? 0  Doing errands, shopping? 0  Preparing Food and eating ? N  Using the Toilet? N  In the past six months, have you accidently leaked urine? N  Do you have problems with loss of bowel control? N  Managing your Medications? N  Managing your Finances? N  Housekeeping or managing your Housekeeping? N    Patient Care Team: Geofm Glade PARAS, MD as PCP - General (Internal Medicine) Patel, Donika K, DO as Consulting Physician (Neurology)  I have updated your Care Teams any recent Medical Services you may have received from other providers in the past year.     Assessment:   This is a routine wellness examination for Wendy Rogers.  Hearing/Vision screen Hearing Screening - Comments:: wears hearing aides Vision Screening - Comments:: Wears eyeglasses/ Dr. Octavia   Goals Addressed             This Visit's Progress    Patient Stated   On track    Maintain current health status, continue to eat healthy, exercise, enjoy life and family.       Depression Screen     11/23/2023    2:48 PM 10/09/2023    1:07 PM 06/27/2023    8:45 AM 04/10/2023    1:09 PM 04/10/2023    1:08 PM 11/17/2022   12:50 PM  10/14/2022   12:59 PM  PHQ 2/9 Scores  PHQ - 2 Score 0 0 0 0 0 0 0  PHQ- 9 Score 0   0       Fall Risk     11/23/2023    2:43 PM 10/17/2023    1:33 PM 10/17/2023    9:02 AM 10/09/2023    1:07 PM 06/27/2023    8:45 AM  Fall Risk   Falls in the past year? 0 0 0 0 1  Number falls in past yr: 0 0 0 0 0  Injury with Fall? 0 0 0 0 1  Risk for fall due to :   No Fall Risks No Fall Risks No Fall Risks  Follow up Falls evaluation completed;Falls prevention discussed Falls evaluation completed Falls evaluation completed  Falls evaluation completed    MEDICARE RISK AT HOME:  Medicare Risk at Home Any stairs in or around the home?: No If so, are there any without handrails?: No Home free of loose throw rugs in walkways, pet beds, electrical cords, etc?: Yes Adequate lighting in your home to reduce risk of falls?: Yes Life alert?: No Use of a cane, walker or w/c?: No Grab bars in the bathroom?: Yes Shower chair or bench in shower?: Yes Elevated toilet seat or a handicapped toilet?: Yes  TIMED UP AND GO:  Was the test performed?  No  Cognitive Function: 6CIT completed    05/15/2017   10:45 AM  MMSE - Mini Mental State Exam  Orientation to time 5   Orientation to Place 5   Registration 3   Attention/ Calculation 5   Recall 2   Language- name 2 objects 2   Language- repeat 1  Language- follow 3 step command 3   Language- read & follow direction 1   Write a sentence 1   Copy design 1   Total score 29  Data saved with a previous flowsheet row definition        11/23/2023    2:44 PM 11/17/2022   12:54 PM  6CIT Screen  What Year? 0 points 0 points  What month? 0 points 0 points  What time? 0 points 0 points  Count back from 20 0 points 0 points  Months in reverse 0 points 4 points  Repeat phrase 0 points 2 points  Total Score 0 points 6 points    Immunizations Immunization History  Administered Date(s) Administered   PFIZER(Purple Top)SARS-COV-2 Vaccination  08/03/2019, 08/24/2019, 03/05/2020    Screening Tests Health Maintenance  Topic Date Due   Pneumococcal Vaccine: 50+ Years (1 of 2 - PCV) Never done   DEXA SCAN  Never done   COVID-19 Vaccine (4 - 2024-25 season) 01/29/2023   INFLUENZA VACCINE  12/29/2023   HEMOGLOBIN A1C  04/10/2024   OPHTHALMOLOGY EXAM  05/02/2024   Diabetic kidney evaluation - eGFR measurement  10/08/2024   Diabetic kidney evaluation - Urine ACR  10/08/2024   FOOT EXAM  10/08/2024   Medicare Annual Wellness (AWV)  11/22/2024   Hepatitis B Vaccines  Aged Out   HPV VACCINES  Aged Out   Meningococcal B Vaccine  Aged Out   DTaP/Tdap/Td  Discontinued   Zoster Vaccines- Shingrix  Discontinued    Health Maintenance  Health Maintenance Due  Topic Date Due   Pneumococcal Vaccine: 50+ Years (1 of 2 - PCV) Never done   DEXA SCAN  Never done   COVID-19 Vaccine (4 - 2024-25 season) 01/29/2023   Health Maintenance Items Addressed: See Nurse Notes at the end of this note  Additional Screening:  Vision Screening: Recommended annual ophthalmology exams for early detection of glaucoma and other disorders of the eye. Would you like a referral to an eye doctor? No    Dental Screening: Recommended annual dental exams for proper oral hygiene  Community Resource Referral / Chronic Care Management: CRR required this visit?  No   CCM required this visit?  No   Plan:    I have personally reviewed and noted the following in the patient's chart:   Medical and social history Use of alcohol, tobacco or illicit drugs  Current medications and supplements including opioid prescriptions. Patient is not currently taking opioid prescriptions. Functional ability and status Nutritional status Physical activity Advanced directives List of other physicians Hospitalizations, surgeries, and ER visits in previous 12 months Vitals Screenings to include cognitive, depression, and falls Referrals and appointments  In addition,  I have reviewed and discussed with patient certain preventive protocols, quality metrics, and best practice recommendations. A written personalized care plan for preventive services as well as general preventive health recommendations were provided to patient.   Eliseo Withers L Jaleigha Deane, CMA   11/23/2023   After Visit Summary: (Mail) Due to this being a telephonic visit, the after visit summary with patients personalized plan was offered to patient via mail   Notes: Patient declines all vaccines.  Patient is up to date on all health maintenance with no concerns to address today.

## 2023-12-14 ENCOUNTER — Ambulatory Visit
Admission: RE | Admit: 2023-12-14 | Discharge: 2023-12-14 | Disposition: A | Discharge status: Home / Self Care | Source: Ambulatory Visit | Attending: Internal Medicine

## 2023-12-14 DIAGNOSIS — Z1231 Encounter for screening mammogram for malignant neoplasm of breast: Secondary | ICD-10-CM | POA: Diagnosis not present

## 2024-01-30 ENCOUNTER — Ambulatory Visit
Admission: RE | Admit: 2024-01-30 | Discharge: 2024-01-30 | Disposition: A | Discharge status: Home / Self Care | Source: Ambulatory Visit | Attending: Internal Medicine | Admitting: Internal Medicine

## 2024-01-30 VITALS — BP 137/71 | HR 92 | Temp 99.7°F | Resp 16

## 2024-01-30 DIAGNOSIS — Z20822 Contact with and (suspected) exposure to covid-19: Secondary | ICD-10-CM

## 2024-01-30 DIAGNOSIS — J069 Acute upper respiratory infection, unspecified: Secondary | ICD-10-CM | POA: Diagnosis not present

## 2024-01-30 NOTE — ED Triage Notes (Signed)
 Pt states cough and chills for the past 3-4 days.  States her son that lives with her has covid.

## 2024-01-30 NOTE — ED Provider Notes (Signed)
 [facility]    CSN: [id number] Arrival date & time: 01/30/24  0949      History   Chief Complaint Chief Complaint  Patient presents with   Cough    HPI Wendy Rogers is a 83 y.o. female.  Here with URI symptoms Today is day 5 Some nasal congestion, cough, and chills No fever, shortness of breath, wheezing, sore throat, headache, abdominal pain, NVD She has used tylenol that is helpful  Her son tested positive for covid several days ago, and they live together   Past Medical History:  Diagnosis Date   Diabetes mellitus without complication (HCC)    Hypertension     Patient Active Problem List   Diagnosis Date Noted   URI (upper respiratory infection) 10/17/2023   Left upper arm pain 06/27/2023   Hypokalemia 10/14/2022   Anemia 10/14/2022   Left shoulder pain 01/24/2020   Gout 05/01/2019   Arthralgia 05/01/2019   Hyperlipidemia 08/15/2018   Ringing in ear, left 06/22/2017   Subclinical hyperthyroidism 06/22/2017   Skin abnormalities 05/15/2017   Diabetic polyneuropathy (HCC) 07/04/2016   Foot pain, bilateral 07/04/2016   Low vitamin B12 level 07/04/2016   Essential hypertension, benign 12/29/2015   Diabetes (HCC) 12/29/2015   Dry eyes 12/29/2015    Past Surgical History:  Procedure Laterality Date   ABDOMINAL HYSTERECTOMY     fibroids   CARPAL TUNNEL RELEASE Bilateral    CHOLECYSTECTOMY      OB History   No obstetric history on file.      Home Medications    Prior to Admission medications   Medication Sig Start Date End Date Taking? Authorizing Provider  allopurinol  (ZYLOPRIM ) 100 MG tablet Take 2 tablets (200 mg total) by mouth daily. 06/27/23   Geofm Glade PARAS, MD  amLODipine  (NORVASC ) 10 MG tablet Take 1 tablet (10 mg total) by mouth daily. For your blood pressure 10/09/23   Burns, Glade PARAS, MD  benzonatate  (TESSALON  PERLES) 100 MG capsule Take 1 capsule (100 mg total) by mouth 3 (three) times daily as needed for cough. 10/17/23    Geofm Glade PARAS, MD  colchicine  0.6 MG tablet Take 1 tablet (0.6 mg total) by mouth daily for 5 days. Take 1 tab daily as needed for gout pain 05/19/23 11/23/23  Darral Longs, MD  gabapentin  (NEURONTIN ) 100 MG capsule Take 2 capsules (200 mg total) by mouth 2 (two) times daily. 10/17/23   Patel, Donika K, DO  losartan  (COZAAR ) 100 MG tablet Take 1 tablet (100 mg total) by mouth daily. 10/17/23   Geofm Glade PARAS, MD  vitamin B-12 (CYANOCOBALAMIN ) 1000 MCG tablet Take 1 tablet (1,000 mcg total) by mouth daily. 08/04/16   Geofm Glade PARAS, MD    Family History Family History  Problem Relation Age of Onset   Diabetes Mother    Hypertension Father    Pancreatic cancer Sister     Social History Social History   Tobacco Use   Smoking status: Never   Smokeless tobacco: Never  Vaping Use   Vaping status: Never Used  Substance Use Topics   Alcohol use: Yes    Comment: occasional, holidays   Drug use: No     Allergies   Penicillins   Review of Systems Review of Systems  Respiratory:  Positive for cough.    .rh  Physical Exam Triage Vital Signs ED Triage Vitals  Encounter Vitals Group     BP 01/30/24 1021 137/71     Girls Systolic BP  Percentile --      Girls Diastolic BP Percentile --      Boys Systolic BP Percentile --      Boys Diastolic BP Percentile --      Pulse Rate 01/30/24 1021 92     Resp 01/30/24 1021 16     Temp 01/30/24 1021 99.7 F (37.6 C)     Temp Source 01/30/24 1021 Oral     SpO2 01/30/24 1021 95 %     Weight --      Height --      Head Circumference --      Peak Flow --      Pain Score 01/30/24 1022 0     Pain Loc --      Pain Education --      Exclude from Growth Chart --    No data found.  Updated Vital Signs BP 137/71 (BP Location: Left Arm)   Pulse 92   Temp 99.7 F (37.6 C) (Oral)   Resp 16   SpO2 95%    Physical Exam Vitals and nursing note reviewed.  Constitutional:      General: She is not in acute distress.    Appearance: Normal  appearance. She is not ill-appearing.  HENT:     Right Ear: Tympanic membrane and ear canal normal.     Left Ear: Tympanic membrane and ear canal normal.     Nose: No congestion or rhinorrhea.     Mouth/Throat:     Mouth: Mucous membranes are moist.     Pharynx: Oropharynx is clear. No oropharyngeal exudate or posterior oropharyngeal erythema.  Eyes:     Conjunctiva/sclera: Conjunctivae normal.  Cardiovascular:     Rate and Rhythm: Normal rate and regular rhythm.     Pulses: Normal pulses.     Heart sounds: Normal heart sounds.  Pulmonary:     Effort: Pulmonary effort is normal.     Breath sounds: Normal breath sounds.  Abdominal:     Palpations: Abdomen is soft.     Tenderness: There is no abdominal tenderness.  Musculoskeletal:     Cervical back: Normal range of motion. No rigidity.  Lymphadenopathy:     Cervical: No cervical adenopathy.  Skin:    General: Skin is warm and dry.  Neurological:     Mental Status: She is alert and oriented to person, place, and time.     Motor: No weakness.     Gait: Gait normal.      UC Treatments / Results  Labs (all labs ordered are listed, but only abnormal results are displayed) Labs Reviewed - No data to display  EKG   Radiology No results found.  Procedures Procedures (including critical care time)  Medications Ordered in UC Medications - No data to display  Initial Impression / Assessment and Plan / UC Course  I have reviewed the triage vital signs and the nursing notes.  Pertinent labs & imaging results that were available during my care of the patient were reviewed by me and considered in my medical decision making (see chart for details).  Patient here with 5 days of URI symptoms.  Son tested positive for COVID.  Discussion with patient that she very likely also has COVID.  Do not recommend testing at this time given there would be no change in treatment plan. Additionally this clinic does not have any rapid covid  tests at this time. She is out of the window for antivirals.  We have discussed  supportive care at home and monitoring symptoms.  I had offered sending in Tessalon  Perles for cough, reports she actually has many of these at home from a previous prescription.  Recommend to use, and continue Tylenol.  We have discussed return and ED precautions.  Patient is agreeable with plan, all questions were answered  Final Clinical Impressions(s) / UC Diagnoses   Final diagnoses:  Close exposure to COVID-19 virus  Viral URI with cough     Discharge Instructions      Tylenol can be used for aches and any fever  The tessalon  perles (cough pills) that you have can be used 3 times daily, 1-2 pills at a time   Keep drinking lots of fluids!  Since today is day 5 of your symptoms, you may only have a few more days until the virus clears out of your system Please return if needed     ED Prescriptions   None    PDMP not reviewed this encounter.   Sadeen Wiegel, [place], PA-C 01/30/24 1057

## 2024-01-30 NOTE — Discharge Instructions (Addendum)
 Tylenol can be used for aches and any fever  The tessalon  perles (cough pills) that you have can be used 3 times daily, 1-2 pills at a time   Keep drinking lots of fluids!  Since today is day 5 of your symptoms, you may only have a few more days until the virus clears out of your system Please return if needed

## 2024-02-01 ENCOUNTER — Other Ambulatory Visit: Payer: Medicare HMO

## 2024-02-19 ENCOUNTER — Ambulatory Visit: Admitting: Neurology

## 2024-02-19 ENCOUNTER — Encounter: Payer: Self-pay | Admitting: Neurology

## 2024-02-19 VITALS — BP 148/75 | HR 98 | Ht <= 58 in | Wt 142.0 lb

## 2024-02-19 DIAGNOSIS — R202 Paresthesia of skin: Secondary | ICD-10-CM | POA: Diagnosis not present

## 2024-02-19 NOTE — Progress Notes (Signed)
 Follow-up Visit   Date: 02/19/2024    Wendy Rogers MRN: [id number] DOB: 1940-08-18    Wendy Rogers is a 82 y.o. right-handed female with well-controlled diabetes mellitus, hyperlipidemia, and hypertension returning to the clinic for follow-up of paresthesia.  The patient was accompanied to the clinic by self.  IMPRESSION/PLAN: Bilateral feet paresthesias, stable and improved on gabapentin  400mg /d.  Normal neurological exam.  Testing with NCS/EMG has been declined.  Possible small fiber neuropathy.   Continue gabapentin  200mg  twice daily  Generalized dysesthesia of the arms, unclear etiology.  Exam does not suggest myelopathy.  NCS/EMG declined.  Continue to monitor.  Return to clinic in 9 months  --------------------------------------------- History of present illness: She reports having spells of numbness/tingling involving the the feet, legs, and arms.  It occurs about twice per week and lasts 5 minutes. There is no specific triggers.  No imbalance, weakness, neck pain, or falls.   She reports that gabapentin  200mg  twice daily helps.  However, if she skips her evening dose, she tends to feel the numbness/tingling more.    She lives with her son.    UPDATE 10/17/2023:  She reports having worsening tingling involving the soles of her feet. She takes gabapentin  200mg  in the morning and 100mg  at bedtime and pain is generally controlled if she takes it on time.  Pain is worse when she has been on her feet all day. It does not occur daily.  She also reports tingling involving the upper arms and thigh.  No associated weakness of imbalance.   UPDATE 02/19/2024:  She is here for follow-up visit.  After increasing gabapentin  to 200mg  twice daily, she has noticed improvement in her feet tingling.  Now, she only had transient tingling at the balls of the feet if she has been walking for > 1.5 mile and symptoms self-resolve.  She continues to have sensation of chills and warmth  over the upper arms.  There is no weakness.   Medications:  Current Outpatient Medications on File Prior to Visit  Medication Sig Dispense Refill   allopurinol  (ZYLOPRIM ) 100 MG tablet Take 2 tablets (200 mg total) by mouth daily. 180 tablet 1   amLODipine  (NORVASC ) 10 MG tablet Take 1 tablet (10 mg total) by mouth daily. For your blood pressure 90 tablet 1   benzonatate  (TESSALON  PERLES) 100 MG capsule Take 1 capsule (100 mg total) by mouth 3 (three) times daily as needed for cough. 30 capsule 0   colchicine  0.6 MG tablet Take 1 tablet (0.6 mg total) by mouth daily for 5 days. Take 1 tab daily as needed for gout pain 5 tablet 0   gabapentin  (NEURONTIN ) 100 MG capsule Take 2 capsules (200 mg total) by mouth 2 (two) times daily. 360 capsule 1   losartan  (COZAAR ) 100 MG tablet Take 1 tablet (100 mg total) by mouth daily.     vitamin B-12 (CYANOCOBALAMIN ) 1000 MCG tablet Take 1 tablet (1,000 mcg total) by mouth daily.     No current facility-administered medications on file prior to visit.    Allergies:  Allergies  Allergen Reactions   Penicillins Hives    Vital Signs:  BP (!) 148/75   Pulse 98   Ht 4' 10 (1.473 m)   Wt 142 lb (64.4 kg)   SpO2 99%   BMI 29.68 kg/m    Neurological Exam: MENTAL STATUS including orientation to time, place, person, recent and remote memory, attention span and concentration, language, and fund of knowledge  is normal.  Speech is not dysarthric.  CRANIAL NERVES:   Pupils equal round and reactive to light.  Normal conjugate, extra-ocular eye movements in all directions of gaze.  No ptosis.  Face is symmetric.   MOTOR:  Motor strength is 5/5 in all extremities.  No atrophy, fasciculations or abnormal movements.  No pronator drift.  Tone is normal.    MSRs:  Reflexes are 2+/4 throughout .  SENSORY:  Intact to vibration and temperature throughout .  COORDINATION/GAIT:  Normal finger-to- nose-finger.  Intact rapid alternating movements bilaterally.  Gait  narrow based and stable.   Data:  Lab Results  Component Value Date   VITAMINB12 331 10/09/2023     Thank you for allowing me to participate in patient's care.  If I can answer any additional questions, I would be pleased to do so.    Sincerely,    Catalea Labrecque K. Tobie, DO

## 2024-04-09 ENCOUNTER — Encounter: Payer: Self-pay | Admitting: Internal Medicine

## 2024-04-09 NOTE — Progress Notes (Unsigned)
      Subjective:    Patient ID: Wendy Rogers, female    DOB: [birth year], 83 y.o.   MRN: [id number]     HPI Wendy Rogers is here for follow up of her chronic medical problems.  ?  Refer to endocrine  Medications and allergies reviewed with patient and updated if appropriate.  Current Outpatient Medications on File Prior to Visit  Medication Sig Dispense Refill   allopurinol  (ZYLOPRIM ) 100 MG tablet Take 2 tablets (200 mg total) by mouth daily. 180 tablet 1   amLODipine  (NORVASC ) 10 MG tablet Take 1 tablet (10 mg total) by mouth daily. For your blood pressure 90 tablet 1   benzonatate  (TESSALON  PERLES) 100 MG capsule Take 1 capsule (100 mg total) by mouth 3 (three) times daily as needed for cough. 30 capsule 0   colchicine  0.6 MG tablet Take 1 tablet (0.6 mg total) by mouth daily for 5 days. Take 1 tab daily as needed for gout pain 5 tablet 0   gabapentin  (NEURONTIN ) 100 MG capsule Take 2 capsules (200 mg total) by mouth 2 (two) times daily. 360 capsule 1   losartan  (COZAAR ) 100 MG tablet Take 1 tablet (100 mg total) by mouth daily.     vitamin B-12 (CYANOCOBALAMIN ) 1000 MCG tablet Take 1 tablet (1,000 mcg total) by mouth daily.     No current facility-administered medications on file prior to visit.     Review of Systems     Objective:  There were no vitals filed for this visit. BP Readings from Last 3 Encounters:  02/19/24 (!) 148/75  01/30/24 137/71  10/17/23 117/66   Wt Readings from Last 3 Encounters:  02/19/24 142 lb (64.4 kg)  11/23/23 142 lb (64.4 kg)  10/17/23 142 lb (64.4 kg)   There is no height or weight on file to calculate BMI.    Physical Exam     Lab Results  Component Value Date   WBC 3.9 (L) 10/09/2023   HGB 12.0 10/09/2023   HCT 37.3 10/09/2023   PLT 274.0 10/09/2023   GLUCOSE 100 (H) 10/09/2023   CHOL 200 10/09/2023   TRIG 155.0 (H) 10/09/2023   HDL 64.40 10/09/2023   LDLDIRECT 114.0 04/11/2022   LDLCALC 105 (H) 10/09/2023   ALT 17  10/09/2023   AST 21 10/09/2023   NA 143 10/09/2023   K 4.4 10/09/2023   CL 107 10/09/2023   CREATININE 0.95 10/09/2023   BUN 22 10/09/2023   CO2 29 10/09/2023   TSH 0.34 (L) 10/09/2023   HGBA1C 5.6 10/09/2023   MICROALBUR <0.7 10/09/2023     Assessment & Plan:    See Problem List for Assessment and Plan of chronic medical problems.

## 2024-04-09 NOTE — Patient Instructions (Addendum)
      Blood work was ordered.       Medications changes include :   None    A referral was ordered and someone will call you to schedule an appointment.     Return in about 6 months (around 10/08/2024) for Physical Exam.

## 2024-04-10 ENCOUNTER — Ambulatory Visit: Admitting: Internal Medicine

## 2024-04-10 VITALS — BP 130/80 | HR 77 | Temp 98.0°F | Ht <= 58 in | Wt 142.0 lb

## 2024-04-10 DIAGNOSIS — E785 Hyperlipidemia, unspecified: Secondary | ICD-10-CM | POA: Diagnosis not present

## 2024-04-10 DIAGNOSIS — E1169 Type 2 diabetes mellitus with other specified complication: Secondary | ICD-10-CM

## 2024-04-10 DIAGNOSIS — I152 Hypertension secondary to endocrine disorders: Secondary | ICD-10-CM | POA: Diagnosis not present

## 2024-04-10 DIAGNOSIS — M109 Gout, unspecified: Secondary | ICD-10-CM | POA: Diagnosis not present

## 2024-04-10 DIAGNOSIS — E1159 Type 2 diabetes mellitus with other circulatory complications: Secondary | ICD-10-CM

## 2024-04-10 DIAGNOSIS — E2839 Other primary ovarian failure: Secondary | ICD-10-CM | POA: Diagnosis not present

## 2024-04-10 DIAGNOSIS — E0590 Thyrotoxicosis, unspecified without thyrotoxic crisis or storm: Secondary | ICD-10-CM | POA: Diagnosis not present

## 2024-04-10 DIAGNOSIS — E1142 Type 2 diabetes mellitus with diabetic polyneuropathy: Secondary | ICD-10-CM | POA: Diagnosis not present

## 2024-04-10 DIAGNOSIS — R7989 Other specified abnormal findings of blood chemistry: Secondary | ICD-10-CM | POA: Diagnosis not present

## 2024-04-10 LAB — COMPREHENSIVE METABOLIC PANEL WITH GFR
ALT: 19 U/L (ref 0–35)
AST: 25 U/L (ref 0–37)
Albumin: 4.2 g/dL (ref 3.5–5.2)
Alkaline Phosphatase: 94 U/L (ref 39–117)
BUN: 22 mg/dL (ref 6–23)
CO2: 26 meq/L (ref 19–32)
Calcium: 9.8 mg/dL (ref 8.4–10.5)
Chloride: 110 meq/L (ref 96–112)
Creatinine, Ser: 0.99 mg/dL (ref 0.40–1.20)
GFR: 52.61 mL/min — ABNORMAL LOW (ref >60.00)
Glucose, Bld: 83 mg/dL (ref 70–99)
Potassium: 3.8 meq/L (ref 3.5–5.1)
Sodium: 143 meq/L (ref 135–145)
Total Bilirubin: 0.6 mg/dL (ref 0.2–1.2)
Total Protein: 6.9 g/dL (ref 6.0–8.3)

## 2024-04-10 LAB — LIPID PANEL
Cholesterol: 174 mg/dL (ref 0–200)
HDL: 69.8 mg/dL (ref >39.00)
LDL Cholesterol: 78 mg/dL (ref 0–99)
NonHDL: 104.43
Total CHOL/HDL Ratio: 2
Triglycerides: 132 mg/dL (ref 0.0–149.0)
VLDL: 26.4 mg/dL (ref 0.0–40.0)

## 2024-04-10 LAB — HEMOGLOBIN A1C: Hgb A1c MFr Bld: 5.4 % (ref 4.6–6.5)

## 2024-04-10 LAB — T4, FREE: Free T4: 0.65 ng/dL (ref 0.60–1.60)

## 2024-04-10 LAB — VITAMIN B12: Vitamin B-12: 613 pg/mL (ref 211–911)

## 2024-04-10 LAB — URIC ACID: Uric Acid, Serum: 5.8 mg/dL (ref 2.4–7.0)

## 2024-04-10 LAB — T3, FREE: T3, Free: 2.7 pg/mL (ref 2.3–4.2)

## 2024-04-10 LAB — TSH: TSH: 0.26 u[IU]/mL — ABNORMAL LOW (ref 0.35–5.50)

## 2024-04-10 MED ORDER — COLCHICINE 0.6 MG PO TABS
ORAL_TABLET | ORAL | 5 refills | Status: AC
Start: 2024-04-10 — End: ?

## 2024-04-10 NOTE — Assessment & Plan Note (Signed)
 Chronic Continue B12 supplementation Check b12 level

## 2024-04-10 NOTE — Assessment & Plan Note (Addendum)
 Chronic Presumed diabetic neuropathy Saw Dr Tobie  - may get EMG Occ still intermittent zaps, burning sensation Continue gabapentin  200 mg twice daily - this is helpful

## 2024-04-10 NOTE — Assessment & Plan Note (Addendum)
 Chronic TSH intermittently low Clinically euthyroid TSH, free T4, free T3

## 2024-04-10 NOTE — Assessment & Plan Note (Signed)
 Chronic Associated with diabetic polyneuropathy  Lab Results  Component Value Date   HGBA1C 5.6 10/09/2023   Sugars well controlled Check A1c Continue lifestyle control Stressed regular exercise, diabetic diet

## 2024-04-10 NOTE — Assessment & Plan Note (Addendum)
 Chronic Not taking allopurinol  200 mg daily -would prefer to take medication only as needed.  Denies any gout flares since off allopurinol  2 months ago No flares since her last visit Check uric acid level Start colchicine  1.2 mg once daily 0.6 mg 1 hour later for gout flares as needed

## 2024-04-10 NOTE — Assessment & Plan Note (Signed)
 Chronic BP controlled CBC, CMP Continue amlodipine  10 mg daily, losartan  to 100 mg daily

## 2024-04-10 NOTE — Assessment & Plan Note (Addendum)
 Chronic Lab Results  Component Value Date   LDLCALC 105 (H) 10/09/2023    LDL above goal and she does have diabetes Check lipid panel, CMP Consider low-dose statin Prefers to avoid medication and work on diet

## 2024-04-13 ENCOUNTER — Ambulatory Visit: Payer: Self-pay | Admitting: Internal Medicine

## 2024-04-13 DIAGNOSIS — E0590 Thyrotoxicosis, unspecified without thyrotoxic crisis or storm: Secondary | ICD-10-CM

## 2024-04-13 DIAGNOSIS — M810 Age-related osteoporosis without current pathological fracture: Secondary | ICD-10-CM

## 2024-04-30 DIAGNOSIS — N1831 Chronic kidney disease, stage 3a: Secondary | ICD-10-CM | POA: Diagnosis not present

## 2024-04-30 DIAGNOSIS — I129 Hypertensive chronic kidney disease with stage 1 through stage 4 chronic kidney disease, or unspecified chronic kidney disease: Secondary | ICD-10-CM | POA: Diagnosis not present

## 2024-04-30 DIAGNOSIS — M109 Gout, unspecified: Secondary | ICD-10-CM | POA: Diagnosis not present

## 2024-04-30 DIAGNOSIS — M1990 Unspecified osteoarthritis, unspecified site: Secondary | ICD-10-CM | POA: Diagnosis not present

## 2024-04-30 DIAGNOSIS — R059 Cough, unspecified: Secondary | ICD-10-CM | POA: Diagnosis not present

## 2024-04-30 DIAGNOSIS — E785 Hyperlipidemia, unspecified: Secondary | ICD-10-CM | POA: Diagnosis not present

## 2024-04-30 DIAGNOSIS — Z8249 Family history of ischemic heart disease and other diseases of the circulatory system: Secondary | ICD-10-CM | POA: Diagnosis not present

## 2024-04-30 DIAGNOSIS — G629 Polyneuropathy, unspecified: Secondary | ICD-10-CM | POA: Diagnosis not present

## 2024-04-30 DIAGNOSIS — Z833 Family history of diabetes mellitus: Secondary | ICD-10-CM | POA: Diagnosis not present

## 2024-05-02 ENCOUNTER — Inpatient Hospital Stay
Admission: RE | Admit: 2024-05-02 | Discharge: 2024-05-02 | Disposition: A | Source: Ambulatory Visit | Attending: Internal Medicine

## 2024-05-02 DIAGNOSIS — E2839 Other primary ovarian failure: Secondary | ICD-10-CM

## 2024-05-04 DIAGNOSIS — M810 Age-related osteoporosis without current pathological fracture: Secondary | ICD-10-CM | POA: Insufficient documentation

## 2024-05-06 ENCOUNTER — Telehealth: Payer: Self-pay

## 2024-05-06 NOTE — Telephone Encounter (Signed)
 Left message for patient to return call to clinic regarding her bone density results.  If she calls back okay to give her following info and schedule follow up with Dr. Geofm:  Bone density shows osteoporosis. She is at a high risk of fracture. Dr. Geofm would like her to make an appt to discuss treatment.

## 2024-05-12 ENCOUNTER — Encounter: Payer: Self-pay | Admitting: Internal Medicine

## 2024-05-12 NOTE — Progress Notes (Deleted)
° ° ° ° °  Subjective:    Patient ID: Wendy Rogers, female    DOB: 03/05/1941, 83 y.o.   MRN: [id number]     HPI Ange is here for follow up of her osteoporosis.  Dexa  05/02/24: spine -0.1, RFN -2.6, LFN -2.5      Medications and allergies reviewed with patient and updated if appropriate.  Medications Ordered Prior to Encounter[1]   Review of Systems     Objective:  There were no vitals filed for this visit. BP Readings from Last 3 Encounters:  04/10/24 130/80  02/19/24 (!) 148/75  01/30/24 137/71   Wt Readings from Last 3 Encounters:  04/10/24 142 lb (64.4 kg)  02/19/24 142 lb (64.4 kg)  11/23/23 142 lb (64.4 kg)   There is no height or weight on file to calculate BMI.    Physical Exam     Lab Results  Component Value Date   WBC 3.9 (L) 10/09/2023   HGB 12.0 10/09/2023   HCT 37.3 10/09/2023   PLT 274.0 10/09/2023   GLUCOSE 83 04/10/2024   CHOL 174 04/10/2024   TRIG 132.0 04/10/2024   HDL 69.80 04/10/2024   LDLDIRECT 114.0 04/11/2022   LDLCALC 78 04/10/2024   ALT 19 04/10/2024   AST 25 04/10/2024   NA 143 04/10/2024   K 3.8 04/10/2024   CL 110 04/10/2024   CREATININE 0.99 04/10/2024   BUN 22 04/10/2024   CO2 26 04/10/2024   TSH 0.26 (L) 04/10/2024   HGBA1C 5.4 04/10/2024   MICROALBUR <0.7 10/09/2023     Assessment & Plan:    See Problem List for Assessment and Plan of chronic medical problems.       [1]  Current Outpatient Medications on File Prior to Visit  Medication Sig Dispense Refill   amLODipine  (NORVASC ) 10 MG tablet Take 1 tablet (10 mg total) by mouth daily. For your blood pressure 90 tablet 1   colchicine  0.6 MG tablet Take 2 tablets once then one hour later take 1 tablet as needed for gout pain 30 tablet 5   gabapentin  (NEURONTIN ) 100 MG capsule Take 2 capsules (200 mg total) by mouth 2 (two) times daily. 360 capsule 1   losartan  (COZAAR ) 100 MG tablet Take 1 tablet (100 mg total) by mouth daily.     vitamin B-12  (CYANOCOBALAMIN ) 1000 MCG tablet Take 1 tablet (1,000 mcg total) by mouth daily.     No current facility-administered medications on file prior to visit.

## 2024-05-12 NOTE — Patient Instructions (Incomplete)
° ° °  ° ° ° °  Medications changes include :   take vitamin d 1000-2000 units daily and calcium  citrate 500 mg twice daily    A referral was ordered for endocrinology and someone will call you to schedule an appointment.

## 2024-05-13 ENCOUNTER — Ambulatory Visit: Admitting: Internal Medicine

## 2024-05-13 ENCOUNTER — Other Ambulatory Visit: Payer: Self-pay | Admitting: Internal Medicine

## 2024-05-13 DIAGNOSIS — E0590 Thyrotoxicosis, unspecified without thyrotoxic crisis or storm: Secondary | ICD-10-CM

## 2024-05-13 DIAGNOSIS — M810 Age-related osteoporosis without current pathological fracture: Secondary | ICD-10-CM

## 2024-05-13 LAB — OPHTHALMOLOGY REPORT-SCANNED

## 2024-10-10 ENCOUNTER — Ambulatory Visit: Admitting: Internal Medicine

## 2024-11-28 ENCOUNTER — Ambulatory Visit
